# Patient Record
Sex: Female | Born: 1980 | Race: White | Hispanic: No | Marital: Married | Smoking: Never smoker
Health system: Southern US, Community
[De-identification: ages and names within clinical notes are randomized; demographics above are authoritative.]

## PROBLEM LIST (undated history)

## (undated) DIAGNOSIS — K5090 Crohn's disease, unspecified, without complications: Secondary | ICD-10-CM

## (undated) DIAGNOSIS — D649 Anemia, unspecified: Secondary | ICD-10-CM

## (undated) DIAGNOSIS — F419 Anxiety disorder, unspecified: Secondary | ICD-10-CM

## (undated) DIAGNOSIS — N939 Abnormal uterine and vaginal bleeding, unspecified: Secondary | ICD-10-CM

## (undated) DIAGNOSIS — N83201 Unspecified ovarian cyst, right side: Secondary | ICD-10-CM

## (undated) DIAGNOSIS — D219 Benign neoplasm of connective and other soft tissue, unspecified: Secondary | ICD-10-CM

## (undated) DIAGNOSIS — N921 Excessive and frequent menstruation with irregular cycle: Secondary | ICD-10-CM

## (undated) DIAGNOSIS — N840 Polyp of corpus uteri: Secondary | ICD-10-CM

## (undated) DIAGNOSIS — G43909 Migraine, unspecified, not intractable, without status migrainosus: Secondary | ICD-10-CM

## (undated) DIAGNOSIS — Z8489 Family history of other specified conditions: Secondary | ICD-10-CM

## (undated) DIAGNOSIS — J45909 Unspecified asthma, uncomplicated: Secondary | ICD-10-CM

## (undated) HISTORY — DX: Migraine, unspecified, not intractable, without status migrainosus: G43.909

## (undated) HISTORY — PX: NO PAST SURGERIES: SHX2092

## (undated) HISTORY — PX: WISDOM TOOTH EXTRACTION: SHX21

## (undated) HISTORY — PX: COLONOSCOPY W/ POLYPECTOMY: SHX1380

---

## 2001-02-26 ENCOUNTER — Encounter: Payer: Self-pay | Admitting: Internal Medicine

## 2001-03-20 ENCOUNTER — Encounter: Payer: Self-pay | Admitting: Internal Medicine

## 2001-04-30 ENCOUNTER — Encounter: Payer: Self-pay | Admitting: Internal Medicine

## 2001-08-06 ENCOUNTER — Encounter: Payer: Self-pay | Admitting: Internal Medicine

## 2001-12-10 ENCOUNTER — Encounter: Payer: Self-pay | Admitting: Internal Medicine

## 2003-08-14 ENCOUNTER — Encounter: Payer: Self-pay | Admitting: Internal Medicine

## 2005-06-23 ENCOUNTER — Encounter: Payer: Self-pay | Admitting: Internal Medicine

## 2005-06-26 ENCOUNTER — Encounter: Payer: Self-pay | Admitting: Internal Medicine

## 2005-07-18 ENCOUNTER — Encounter: Payer: Self-pay | Admitting: Internal Medicine

## 2007-08-06 ENCOUNTER — Encounter: Payer: Self-pay | Admitting: Internal Medicine

## 2007-11-05 ENCOUNTER — Encounter: Payer: Self-pay | Admitting: Internal Medicine

## 2007-12-31 ENCOUNTER — Encounter: Payer: Self-pay | Admitting: Internal Medicine

## 2008-03-18 DIAGNOSIS — K603 Anal fistula: Secondary | ICD-10-CM

## 2008-03-18 DIAGNOSIS — K5090 Crohn's disease, unspecified, without complications: Secondary | ICD-10-CM | POA: Insufficient documentation

## 2008-03-18 DIAGNOSIS — M949 Disorder of cartilage, unspecified: Secondary | ICD-10-CM

## 2008-03-18 DIAGNOSIS — J45990 Exercise induced bronchospasm: Secondary | ICD-10-CM

## 2008-03-18 DIAGNOSIS — Z862 Personal history of diseases of the blood and blood-forming organs and certain disorders involving the immune mechanism: Secondary | ICD-10-CM

## 2008-03-18 DIAGNOSIS — L408 Other psoriasis: Secondary | ICD-10-CM

## 2008-03-18 DIAGNOSIS — M899 Disorder of bone, unspecified: Secondary | ICD-10-CM | POA: Insufficient documentation

## 2008-03-19 ENCOUNTER — Ambulatory Visit: Payer: Self-pay | Admitting: Internal Medicine

## 2008-03-19 LAB — CONVERTED CEMR LAB
Basophils Relative: 0.8 % (ref 0.0–1.0)
Eosinophils Absolute: 0.3 10*3/uL (ref 0.0–0.7)
Eosinophils Relative: 4.7 % (ref 0.0–5.0)
Hemoglobin: 12.3 g/dL (ref 12.0–15.0)
Lymphocytes Relative: 28.9 % (ref 12.0–46.0)
MCV: 82.3 fL (ref 78.0–100.0)
Neutro Abs: 3.5 10*3/uL (ref 1.4–7.7)
Neutrophils Relative %: 57.5 % (ref 43.0–77.0)
RBC: 4.42 M/uL (ref 3.87–5.11)
Saturation Ratios: 8.3 % — ABNORMAL LOW (ref 20.0–50.0)
Sed Rate: 10 mm/hr (ref 0–22)
WBC: 6.1 10*3/uL (ref 4.5–10.5)

## 2008-03-20 ENCOUNTER — Encounter: Payer: Self-pay | Admitting: Internal Medicine

## 2008-05-21 ENCOUNTER — Encounter: Payer: Self-pay | Admitting: Internal Medicine

## 2008-06-05 ENCOUNTER — Ambulatory Visit: Payer: Self-pay | Admitting: Internal Medicine

## 2008-06-05 LAB — CONVERTED CEMR LAB
Basophils Relative: 0.9 % (ref 0.0–3.0)
HCT: 41 % (ref 36.0–46.0)
Hemoglobin: 13.9 g/dL (ref 12.0–15.0)
Lymphocytes Relative: 25.2 % (ref 12.0–46.0)
Monocytes Relative: 10.3 % (ref 3.0–12.0)
Neutro Abs: 3 10*3/uL (ref 1.4–7.7)
RBC: 4.72 M/uL (ref 3.87–5.11)

## 2008-07-08 ENCOUNTER — Encounter: Payer: Self-pay | Admitting: Internal Medicine

## 2008-07-20 ENCOUNTER — Telehealth: Payer: Self-pay | Admitting: Internal Medicine

## 2008-07-23 ENCOUNTER — Telehealth: Payer: Self-pay | Admitting: Internal Medicine

## 2008-07-30 ENCOUNTER — Ambulatory Visit: Payer: Self-pay | Admitting: Internal Medicine

## 2008-07-30 LAB — CONVERTED CEMR LAB
ALT: 62 units/L — ABNORMAL HIGH (ref 0–35)
AST: 109 units/L — ABNORMAL HIGH (ref 0–37)
Alkaline Phosphatase: 48 units/L (ref 39–117)
Bilirubin, Direct: 0.2 mg/dL (ref 0.0–0.3)
CO2: 31 meq/L (ref 19–32)
Chloride: 104 meq/L (ref 96–112)
Glucose, Bld: 67 mg/dL — ABNORMAL LOW (ref 70–99)
Potassium: 4.3 meq/L (ref 3.5–5.1)
Sodium: 140 meq/L (ref 135–145)
Total Protein: 7 g/dL (ref 6.0–8.3)

## 2008-08-10 ENCOUNTER — Telehealth: Payer: Self-pay | Admitting: Internal Medicine

## 2008-08-12 ENCOUNTER — Encounter: Payer: Self-pay | Admitting: Internal Medicine

## 2008-08-19 ENCOUNTER — Ambulatory Visit (HOSPITAL_COMMUNITY): Admission: RE | Admit: 2008-08-19 | Discharge: 2008-08-19 | Payer: Self-pay | Admitting: Internal Medicine

## 2008-08-20 ENCOUNTER — Telehealth: Payer: Self-pay | Admitting: Internal Medicine

## 2008-08-24 ENCOUNTER — Ambulatory Visit: Payer: Self-pay | Admitting: Internal Medicine

## 2008-08-24 LAB — CONVERTED CEMR LAB
AST: 19 units/L (ref 0–37)
Basophils Absolute: 0 10*3/uL (ref 0.0–0.1)
Hemoglobin: 13.7 g/dL (ref 12.0–15.0)
Lymphocytes Relative: 18.9 % (ref 12.0–46.0)
MCHC: 34.4 g/dL (ref 30.0–36.0)
Neutro Abs: 4.7 10*3/uL (ref 1.4–7.7)
Neutrophils Relative %: 65.2 % (ref 43.0–77.0)
RDW: 13.1 % (ref 11.5–14.6)
Total Bilirubin: 0.6 mg/dL (ref 0.3–1.2)

## 2008-08-31 ENCOUNTER — Telehealth: Payer: Self-pay | Admitting: Internal Medicine

## 2008-09-22 ENCOUNTER — Ambulatory Visit: Payer: Self-pay | Admitting: Internal Medicine

## 2008-09-22 LAB — CONVERTED CEMR LAB
ALT: 16 units/L (ref 0–35)
Albumin: 3.6 g/dL (ref 3.5–5.2)
Total Bilirubin: 0.6 mg/dL (ref 0.3–1.2)

## 2008-09-28 ENCOUNTER — Encounter: Payer: Self-pay | Admitting: Internal Medicine

## 2008-10-05 ENCOUNTER — Encounter: Payer: Self-pay | Admitting: Internal Medicine

## 2008-11-10 ENCOUNTER — Ambulatory Visit: Payer: Self-pay | Admitting: Internal Medicine

## 2008-11-10 LAB — CONVERTED CEMR LAB
ALT: 15 units/L (ref 0–35)
Bilirubin, Direct: 0.1 mg/dL (ref 0.0–0.3)
Total Bilirubin: 0.7 mg/dL (ref 0.3–1.2)

## 2008-12-14 ENCOUNTER — Telehealth: Payer: Self-pay | Admitting: Internal Medicine

## 2008-12-25 ENCOUNTER — Ambulatory Visit: Payer: Self-pay | Admitting: Internal Medicine

## 2008-12-28 LAB — CONVERTED CEMR LAB
AST: 21 units/L (ref 0–37)
Albumin: 3.7 g/dL (ref 3.5–5.2)
BUN: 12 mg/dL (ref 6–23)
Basophils Relative: 0.9 % (ref 0.0–3.0)
Calcium: 9 mg/dL (ref 8.4–10.5)
Creatinine, Ser: 0.8 mg/dL (ref 0.4–1.2)
Eosinophils Absolute: 0.3 10*3/uL (ref 0.0–0.7)
Eosinophils Relative: 6 % — ABNORMAL HIGH (ref 0.0–5.0)
GFR calc non Af Amer: 91.71 mL/min (ref >60)
Glucose, Bld: 75 mg/dL (ref 70–99)
HCT: 38.7 % (ref 36.0–46.0)
Hemoglobin: 12.9 g/dL (ref 12.0–15.0)
Lymphs Abs: 0.8 10*3/uL (ref 0.7–4.0)
MCHC: 33.5 g/dL (ref 30.0–36.0)
MCV: 87 fL (ref 78.0–100.0)
Monocytes Absolute: 0.8 10*3/uL (ref 0.1–1.0)
Neutro Abs: 3.1 10*3/uL (ref 1.4–7.7)
Neutrophils Relative %: 61 % (ref 43.0–77.0)
RBC: 4.44 M/uL (ref 3.87–5.11)
Sed Rate: 11 mm/hr (ref 0–22)
Total Bilirubin: 0.6 mg/dL (ref 0.3–1.2)
WBC: 5 10*3/uL (ref 4.5–10.5)

## 2008-12-31 ENCOUNTER — Ambulatory Visit: Payer: Self-pay | Admitting: Gastroenterology

## 2008-12-31 ENCOUNTER — Telehealth: Payer: Self-pay | Admitting: Internal Medicine

## 2008-12-31 ENCOUNTER — Emergency Department (HOSPITAL_COMMUNITY): Admission: EM | Admit: 2008-12-31 | Discharge: 2008-12-31 | Payer: Self-pay | Admitting: Emergency Medicine

## 2009-01-04 ENCOUNTER — Ambulatory Visit: Payer: Self-pay | Admitting: Internal Medicine

## 2009-01-05 LAB — CONVERTED CEMR LAB
AST: 200 units/L — ABNORMAL HIGH (ref 0–37)
Albumin: 3 g/dL — ABNORMAL LOW (ref 3.5–5.2)
Alkaline Phosphatase: 396 units/L — ABNORMAL HIGH (ref 39–117)
Total Bilirubin: 1.3 mg/dL — ABNORMAL HIGH (ref 0.3–1.2)

## 2009-01-12 ENCOUNTER — Ambulatory Visit: Payer: Self-pay | Admitting: Internal Medicine

## 2009-01-12 LAB — CONVERTED CEMR LAB
ALT: 111 units/L — ABNORMAL HIGH (ref 0–35)
AST: 58 units/L — ABNORMAL HIGH (ref 0–37)
Albumin: 3.3 g/dL — ABNORMAL LOW (ref 3.5–5.2)
Alkaline Phosphatase: 261 units/L — ABNORMAL HIGH (ref 39–117)

## 2009-01-13 ENCOUNTER — Ambulatory Visit: Payer: Self-pay | Admitting: Internal Medicine

## 2009-01-13 DIAGNOSIS — R740 Nonspecific elevation of levels of transaminase and lactic acid dehydrogenase [LDH]: Secondary | ICD-10-CM

## 2009-01-21 ENCOUNTER — Ambulatory Visit: Payer: Self-pay | Admitting: Internal Medicine

## 2009-01-21 LAB — CONVERTED CEMR LAB
ALT: 38 units/L — ABNORMAL HIGH (ref 0–35)
AST: 30 units/L (ref 0–37)
Albumin: 3.5 g/dL (ref 3.5–5.2)
Total Bilirubin: 1 mg/dL (ref 0.3–1.2)

## 2009-01-22 ENCOUNTER — Telehealth: Payer: Self-pay | Admitting: Internal Medicine

## 2009-02-04 ENCOUNTER — Ambulatory Visit: Payer: Self-pay | Admitting: Internal Medicine

## 2009-02-04 LAB — CONVERTED CEMR LAB
ALT: 21 units/L (ref 0–35)
AST: 26 units/L (ref 0–37)
Bilirubin, Direct: 0.1 mg/dL (ref 0.0–0.3)
Total Bilirubin: 0.9 mg/dL (ref 0.3–1.2)

## 2009-02-08 ENCOUNTER — Emergency Department (HOSPITAL_COMMUNITY): Admission: EM | Admit: 2009-02-08 | Discharge: 2009-02-08 | Payer: Self-pay | Admitting: Emergency Medicine

## 2009-02-08 ENCOUNTER — Telehealth: Payer: Self-pay | Admitting: Internal Medicine

## 2009-03-11 ENCOUNTER — Ambulatory Visit: Payer: Self-pay | Admitting: Internal Medicine

## 2009-03-25 ENCOUNTER — Ambulatory Visit: Payer: Self-pay | Admitting: Internal Medicine

## 2009-03-25 LAB — CONVERTED CEMR LAB
ALT: 20 U/L
AST: 24 U/L
Albumin: 3.9 g/dL
Alkaline Phosphatase: 60 U/L
BUN: 11 mg/dL
Basophils Absolute: 0 10*3/uL
Basophils Relative: 0.1 %
CO2: 30 meq/L
Calcium: 9.4 mg/dL
Chloride: 106 meq/L
Creatinine, Ser: 0.7 mg/dL
Eosinophils Absolute: 0.2 10*3/uL
Eosinophils Relative: 3.3 %
GFR calc non Af Amer: 106.79 mL/min
Glucose, Bld: 64 mg/dL — ABNORMAL LOW
HCT: 39.5 %
Hemoglobin: 13 g/dL
Iron: 28 ug/dL — ABNORMAL LOW
Lymphocytes Relative: 19 %
Lymphs Abs: 0.9 10*3/uL
MCHC: 32.8 g/dL
MCV: 85.3 fL
Monocytes Absolute: 0.5 10*3/uL
Monocytes Relative: 10.4 %
Neutro Abs: 3.1 10*3/uL
Neutrophils Relative %: 67.2 %
Platelets: 205 10*3/uL
Potassium: 4.2 meq/L
RBC: 4.63 M/uL
RDW: 13.3 %
Saturation Ratios: 7.6 % — ABNORMAL LOW
Sodium: 141 meq/L
TSH: 1.23 u[IU]/mL
Total Bilirubin: 0.6 mg/dL
Total Protein: 7.6 g/dL
Transferrin: 264.3 mg/dL
Vitamin B-12: 673 pg/mL
WBC: 4.7 10*3/uL

## 2009-04-07 ENCOUNTER — Telehealth: Payer: Self-pay | Admitting: Internal Medicine

## 2009-04-07 ENCOUNTER — Encounter: Payer: Self-pay | Admitting: Internal Medicine

## 2009-04-13 ENCOUNTER — Telehealth: Payer: Self-pay | Admitting: Internal Medicine

## 2009-04-19 ENCOUNTER — Ambulatory Visit (HOSPITAL_COMMUNITY): Admission: RE | Admit: 2009-04-19 | Discharge: 2009-04-19 | Payer: Self-pay | Admitting: Internal Medicine

## 2009-05-27 ENCOUNTER — Ambulatory Visit: Payer: Self-pay | Admitting: Internal Medicine

## 2009-05-28 LAB — CONVERTED CEMR LAB
Basophils Absolute: 0.1 10*3/uL (ref 0.0–0.1)
HCT: 40.8 % (ref 36.0–46.0)
Iron: 69 ug/dL (ref 42–145)
Lymphs Abs: 1.4 10*3/uL (ref 0.7–4.0)
Monocytes Relative: 7.9 % (ref 3.0–12.0)
Neutrophils Relative %: 67.3 % (ref 43.0–77.0)
Platelets: 235 10*3/uL (ref 150.0–400.0)
RDW: 17.4 % — ABNORMAL HIGH (ref 11.5–14.6)
Transferrin: 175.9 mg/dL — ABNORMAL LOW (ref 212.0–360.0)

## 2009-08-17 ENCOUNTER — Encounter: Payer: Self-pay | Admitting: Internal Medicine

## 2009-09-01 ENCOUNTER — Telehealth: Payer: Self-pay | Admitting: Internal Medicine

## 2009-10-25 ENCOUNTER — Ambulatory Visit: Payer: Self-pay | Admitting: Internal Medicine

## 2009-10-25 LAB — CONVERTED CEMR LAB
AST: 17 units/L (ref 0–37)
Albumin: 3.7 g/dL (ref 3.5–5.2)
Alkaline Phosphatase: 51 units/L (ref 39–117)
Eosinophils Absolute: 0.1 10*3/uL (ref 0.0–0.7)
Eosinophils Relative: 1.8 % (ref 0.0–5.0)
MCHC: 32.2 g/dL (ref 30.0–36.0)
MCV: 91.4 fL (ref 78.0–100.0)
Monocytes Absolute: 0.5 10*3/uL (ref 0.1–1.0)
Neutrophils Relative %: 53.9 % (ref 43.0–77.0)
Platelets: 325 10*3/uL (ref 150.0–400.0)
Potassium: 4.2 meq/L (ref 3.5–5.1)
Sed Rate: 17 mm/hr (ref 0–22)
Sodium: 139 meq/L (ref 135–145)
Total Protein: 6.8 g/dL (ref 6.0–8.3)
WBC: 6.9 10*3/uL (ref 4.5–10.5)

## 2009-11-15 ENCOUNTER — Telehealth: Payer: Self-pay | Admitting: Internal Medicine

## 2009-11-16 ENCOUNTER — Telehealth: Payer: Self-pay | Admitting: Internal Medicine

## 2009-11-24 ENCOUNTER — Ambulatory Visit: Payer: Self-pay | Admitting: Internal Medicine

## 2009-11-25 LAB — CONVERTED CEMR LAB
CO2: 32 meq/L (ref 19–32)
Calcium: 9.2 mg/dL (ref 8.4–10.5)
Eosinophils Relative: 1.4 % (ref 0.0–5.0)
GFR calc non Af Amer: 106.26 mL/min (ref >60)
Glucose, Bld: 61 mg/dL — ABNORMAL LOW (ref 70–99)
HCT: 39.6 % (ref 36.0–46.0)
Hemoglobin: 13.1 g/dL (ref 12.0–15.0)
Lymphs Abs: 2 10*3/uL (ref 0.7–4.0)
Monocytes Relative: 7.3 % (ref 3.0–12.0)
Neutro Abs: 6.3 10*3/uL (ref 1.4–7.7)
Platelets: 316 10*3/uL (ref 150.0–400.0)
Sed Rate: 16 mm/hr (ref 0–22)
Sodium: 140 meq/L (ref 135–145)
Total Bilirubin: 0.3 mg/dL (ref 0.3–1.2)
Total Protein: 7 g/dL (ref 6.0–8.3)
WBC: 9.1 10*3/uL (ref 4.5–10.5)

## 2010-01-14 ENCOUNTER — Telehealth: Payer: Self-pay | Admitting: Internal Medicine

## 2010-01-14 ENCOUNTER — Ambulatory Visit: Payer: Self-pay | Admitting: Internal Medicine

## 2010-01-15 LAB — CONVERTED CEMR LAB
ALT: 22 units/L (ref 0–35)
AST: 24 units/L (ref 0–37)
Alkaline Phosphatase: 48 units/L (ref 39–117)
Basophils Absolute: 0 10*3/uL (ref 0.0–0.1)
Bilirubin, Direct: 0.1 mg/dL (ref 0.0–0.3)
CO2: 31 meq/L (ref 19–32)
Chloride: 99 meq/L (ref 96–112)
Hemoglobin: 13.3 g/dL (ref 12.0–15.0)
Lymphocytes Relative: 21.2 % (ref 12.0–46.0)
Monocytes Relative: 13 % — ABNORMAL HIGH (ref 3.0–12.0)
Neutro Abs: 5.2 10*3/uL (ref 1.4–7.7)
Neutrophils Relative %: 63.7 % (ref 43.0–77.0)
Platelets: 237 10*3/uL (ref 150.0–400.0)
Potassium: 3.6 meq/L (ref 3.5–5.1)
RDW: 13.8 % (ref 11.5–14.6)
Sodium: 136 meq/L (ref 135–145)
Total Bilirubin: 0.3 mg/dL (ref 0.3–1.2)
Total Protein: 6.7 g/dL (ref 6.0–8.3)

## 2010-01-18 ENCOUNTER — Telehealth: Payer: Self-pay | Admitting: Internal Medicine

## 2010-01-24 ENCOUNTER — Ambulatory Visit: Payer: Self-pay | Admitting: Internal Medicine

## 2010-02-08 ENCOUNTER — Ambulatory Visit: Payer: Self-pay | Admitting: Internal Medicine

## 2010-02-10 ENCOUNTER — Encounter: Payer: Self-pay | Admitting: Internal Medicine

## 2010-02-16 ENCOUNTER — Ambulatory Visit: Payer: Self-pay | Admitting: Internal Medicine

## 2010-04-01 ENCOUNTER — Encounter: Payer: Self-pay | Admitting: Internal Medicine

## 2010-04-11 ENCOUNTER — Ambulatory Visit: Payer: Self-pay | Admitting: Internal Medicine

## 2010-04-11 LAB — CONVERTED CEMR LAB
Albumin: 3.6 g/dL (ref 3.5–5.2)
Alkaline Phosphatase: 60 units/L (ref 39–117)
Basophils Absolute: 0 10*3/uL (ref 0.0–0.1)
Bilirubin, Direct: 0.1 mg/dL (ref 0.0–0.3)
Eosinophils Absolute: 0.4 10*3/uL (ref 0.0–0.7)
Lymphocytes Relative: 24.2 % (ref 12.0–46.0)
MCHC: 33.6 g/dL (ref 30.0–36.0)
MCV: 88.1 fL (ref 78.0–100.0)
Monocytes Absolute: 0.6 10*3/uL (ref 0.1–1.0)
Neutro Abs: 6.5 10*3/uL (ref 1.4–7.7)
Neutrophils Relative %: 65.7 % (ref 43.0–77.0)
RDW: 14.2 % (ref 11.5–14.6)

## 2010-04-12 ENCOUNTER — Encounter: Payer: Self-pay | Admitting: Internal Medicine

## 2010-04-13 ENCOUNTER — Encounter: Payer: Self-pay | Admitting: Internal Medicine

## 2010-04-18 ENCOUNTER — Telehealth: Payer: Self-pay | Admitting: Internal Medicine

## 2010-04-27 ENCOUNTER — Encounter: Payer: Self-pay | Admitting: Internal Medicine

## 2010-05-09 ENCOUNTER — Ambulatory Visit: Payer: Self-pay | Admitting: Internal Medicine

## 2010-05-09 ENCOUNTER — Telehealth: Payer: Self-pay | Admitting: Internal Medicine

## 2010-05-09 LAB — CONVERTED CEMR LAB
ALT: 12 units/L (ref 0–35)
BUN: 7 mg/dL (ref 6–23)
Basophils Absolute: 0.1 10*3/uL (ref 0.0–0.1)
Basophils Relative: 0.9 % (ref 0.0–3.0)
CO2: 32 meq/L (ref 19–32)
Calcium: 8.7 mg/dL (ref 8.4–10.5)
Chloride: 99 meq/L (ref 96–112)
Creatinine, Ser: 0.7 mg/dL (ref 0.4–1.2)
GFR calc non Af Amer: 111.4 mL/min (ref >60)
Glucose, Bld: 78 mg/dL (ref 70–99)
HCT: 40.4 % (ref 36.0–46.0)
Hemoglobin: 13.5 g/dL (ref 12.0–15.0)
Lymphocytes Relative: 40.1 % (ref 12.0–46.0)
Lymphs Abs: 3.2 10*3/uL (ref 0.7–4.0)
Monocytes Relative: 17.9 % — ABNORMAL HIGH (ref 3.0–12.0)
Neutro Abs: 2.7 10*3/uL (ref 1.4–7.7)
RBC: 4.6 M/uL (ref 3.87–5.11)
RDW: 14.5 % (ref 11.5–14.6)
Total Bilirubin: 0.3 mg/dL (ref 0.3–1.2)

## 2010-05-17 ENCOUNTER — Telehealth: Payer: Self-pay | Admitting: Internal Medicine

## 2010-05-20 ENCOUNTER — Ambulatory Visit: Payer: Self-pay | Admitting: Internal Medicine

## 2010-05-25 ENCOUNTER — Telehealth: Payer: Self-pay | Admitting: Internal Medicine

## 2010-06-22 ENCOUNTER — Telehealth: Payer: Self-pay | Admitting: Internal Medicine

## 2010-06-24 ENCOUNTER — Encounter: Payer: Self-pay | Admitting: Internal Medicine

## 2010-07-05 ENCOUNTER — Telehealth: Payer: Self-pay | Admitting: Internal Medicine

## 2010-07-06 ENCOUNTER — Ambulatory Visit: Payer: Self-pay | Admitting: Physician Assistant

## 2010-07-06 ENCOUNTER — Ambulatory Visit: Payer: Self-pay | Admitting: Gastroenterology

## 2010-07-06 ENCOUNTER — Ambulatory Visit: Payer: Self-pay | Admitting: Cardiovascular Disease

## 2010-07-06 DIAGNOSIS — R634 Abnormal weight loss: Secondary | ICD-10-CM

## 2010-07-06 DIAGNOSIS — R197 Diarrhea, unspecified: Secondary | ICD-10-CM

## 2010-07-06 DIAGNOSIS — K5080 Crohn's disease of both small and large intestine without complications: Secondary | ICD-10-CM

## 2010-07-07 ENCOUNTER — Encounter: Payer: Self-pay | Admitting: Physician Assistant

## 2010-07-07 LAB — CONVERTED CEMR LAB
Basophils Absolute: 0 10*3/uL (ref 0.0–0.1)
Basophils Relative: 0.4 % (ref 0.0–3.0)
CO2: 32 meq/L (ref 19–32)
Calcium: 8.7 mg/dL (ref 8.4–10.5)
Chloride: 99 meq/L (ref 96–112)
Creatinine, Ser: 0.8 mg/dL (ref 0.4–1.2)
Eosinophils Relative: 1.3 % (ref 0.0–5.0)
Glucose, Bld: 59 mg/dL — ABNORMAL LOW (ref 70–99)
HCT: 41.4 % (ref 36.0–46.0)
Hemoglobin: 13.5 g/dL (ref 12.0–15.0)
Lymphocytes Relative: 15.4 % (ref 12.0–46.0)
Lymphs Abs: 1.4 10*3/uL (ref 0.7–4.0)
Monocytes Relative: 8 % (ref 3.0–12.0)
Neutro Abs: 7 10*3/uL (ref 1.4–7.7)
RBC: 4.77 M/uL (ref 3.87–5.11)
RDW: 15.2 % — ABNORMAL HIGH (ref 11.5–14.6)
WBC: 9.3 10*3/uL (ref 4.5–10.5)

## 2010-07-08 ENCOUNTER — Encounter: Payer: Self-pay | Admitting: Physician Assistant

## 2010-07-13 ENCOUNTER — Telehealth: Payer: Self-pay | Admitting: Physician Assistant

## 2010-07-25 ENCOUNTER — Telehealth: Payer: Self-pay | Admitting: Physician Assistant

## 2010-07-29 ENCOUNTER — Telehealth: Payer: Self-pay | Admitting: Physician Assistant

## 2010-08-02 ENCOUNTER — Ambulatory Visit: Payer: Self-pay | Admitting: Internal Medicine

## 2010-08-09 ENCOUNTER — Encounter: Payer: Self-pay | Admitting: Internal Medicine

## 2010-09-29 ENCOUNTER — Telehealth: Payer: Self-pay | Admitting: Internal Medicine

## 2010-10-17 ENCOUNTER — Encounter: Payer: Self-pay | Admitting: Internal Medicine

## 2010-11-01 NOTE — Progress Notes (Signed)
Summary: CONDITION UPDATE  Phone Note Call from Patient Call back at Home Phone [phone]   Caller: Patient Call For: Dr. Juanda Chance Reason for Call: Talk to Nurse Summary of Call: CONDITION UPDATE FROM 10-25-09 Initial call taken by: Karna Christmas,  November 15, 2009 2:26 PM  Follow-up for Phone Call        Message left for patient to callback. Laureen Ochs LPN  November 15, 2009 3:08 PM   Pt. was last seen 10-25-09, was started on Entocort,is still on 9mg  daily.  Continues with loose to watery stools, 6-8 movements daily. Blood with most BM's, has actually gotten worse.  Abd. pain is worse.  Denies fever, n/v. Took Immodium today, seemed to help w/diarrhea.  DR.Cayman Brogden PLEASE ADVISE  Follow-up by: Laureen Ochs LPN,  November 15, 2009 4:17 PM  Additional Follow-up for Phone Call Additional follow up Details #1::        Gavin Pound, please send Asacal 400mg , # 360 , 4 by mouth three times a day, 3 refills, I have spoken to the pt tonight. She will check with [place] in 2 weeks. Additional Follow-up by: Hart Carwin MD,  November 15, 2009 6:43 PM    Additional Follow-up for Phone Call Additional follow up Details #2::    Meds to pt. pharmacy. Follow-up by: Laureen Ochs LPN,  November 16, 2009 8:51 AM  New/Updated Medications: ASACOL 400 MG  TBEC (MESALAMINE) Take 4 caps by mouth 3 times daily. Prescriptions: ASACOL 400 MG  TBEC (MESALAMINE) Take 4 caps by mouth 3 times daily.  #360 x 3   Entered by:   Laureen Ochs LPN   Authorized by:   Hart Carwin MD   Signed by:   Laureen Ochs LPN on 09/81/1914   Method used:   Electronically to        [facility]* (retail)       [address]       [address] Shipping/mailing       [place], [place]  [address]       Ph: [phone]       Fax: [phone]   RxID:   [id number]

## 2010-11-01 NOTE — Assessment & Plan Note (Signed)
Summary: F/U Crohn's flare, saw Mike Gip PA   History of Present Illness Primary GI MD: Lina Sar MD Primary Payslie Mccaig: Elizabeth Sauer, MD Requesting Kalid Ghan: n/a Chief Complaint: f/u Crohns disease flare up. Pt states after stopping the ATB's, she has progressively getting worse. Pt has 8-10 watery loose stools a day with intermittant pain, body aches, fever and overall fatigue. History of Present Illness:   This is 30 year old white female with Crohn's disease of the colon and terminal ileum. She was diagnosed in 2002 at [facility] after presenting with a rectal fistula. She was followed there until June 2009 when she moved to [place]. She was sent in remission when I first met her but since then has had a flareup of diarrhea, cramping and abdominal pain. She is intolerant to Remicade and Humira due to a flareup of psoriasis. She is unable to take Imuran because of bone marrow suppression. She has been flared up now for 4 months and has not responded to Entocort, Prednisone 40 mg a day, and most recently to Cimzia which was started in September of this year. She continues to have about 10 bowel movements a day, 1-2 at night. She has had some crampy lower abdominal pain. There has been no rectal bleeding. She has had a weight loss of 6 pounds since her last appointment. A CT Scan of the abdomen in October of 2011 showed Crohn's disease of the terminal ileum and left colon. Her last colonoscopy in May 2011 showed low-grade colitis with pseudopolyps and Crohn's disease in the terminal ileum. Biopsies showed chronic active mucosal colitis. Additional problems include osteopenia, chronic iron deficiency and psoriasis.   GI Review of Systems    Reports abdominal pain and  loss of appetite.     Location of  Abdominal pain: lower abdomen.    Denies acid reflux, belching, bloating, chest pain, dysphagia with liquids, dysphagia with solids, heartburn, vomiting, vomiting blood,  weight loss, and  weight gain.      Reports diarrhea.     Denies anal fissure, black tarry stools, constipation, diverticulosis, fecal incontinence, heme positive stool, hemorrhoids, irritable bowel syndrome, jaundice, light color stool, liver problems, rectal bleeding, and  rectal pain.    Current Medications (verified): 1)  Asacol 400 Mg  Tbec (Mesalamine) .... Take 4 Caps By Mouth 3 Times Daily. 2)  Lomotil 2.5-0.025 Mg  Tabs (Diphenoxylate-Atropine) .... Take 1 By Mouth Daily As Needed 3)  Cimzia 2 X 200 Mg/ml Kit (Certolizumab Pegol) .... Every 4 Weeks 4)  Prednisone 20 Mg Tabs (Prednisone) .... 2 Tablets By Mouth Every Morning  Allergies (verified): No Known Drug Allergies  Past History:  Past Medical History: Reviewed history from 07/06/2010 and no changes required. 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 Unremarkable  Family History: Reviewed history from 03/19/2008 and no changes required. Family History of Juvenile Rhematoid Arthritis: First Cousin Family History of Diabetes: grandmother paternal  grandfather maternal No FH of Colon Cancer: Family History of Irritable Bowel Syndrome:uncle Father has gastric ulcers  Social History: Reviewed history from 03/19/2008 and no changes required. 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  Alcohol Use - yes Daily Caffeine Use  Review of Systems       The patient complains of fatigue and fever.  The  patient denies allergy/sinus, anemia, anxiety-new, arthritis/joint pain, back pain, blood in urine, breast changes/lumps, change in vision, confusion, cough, coughing up blood, depression-new, fainting, headaches-new, hearing problems, heart murmur,  heart rhythm changes, itching, menstrual pain, muscle pains/cramps, night sweats, nosebleeds, pregnancy symptoms, shortness of breath, skin rash, sleeping problems, sore throat, swelling of feet/legs, swollen lymph glands, thirst - excessive , urination - excessive , urination changes/pain, urine leakage, vision changes, and voice change.         Pertinent positive and negative review of systems were noted in the above HPI. All other ROS was otherwise negative.   Vital Signs:  Patient profile:   30 year old female Height:      65 inches Weight:      111.13 pounds BMI:     18.56 BSA:     1.54 Temp:     98.3 degrees F oral Pulse rate:   100 / minute Pulse rhythm:   regular BP sitting:   98 / 68  (left arm) Cuff size:   regular  Vitals Entered By: Christie Nottingham CMA Duncan Dull) (August 02, 2010 1:24 PM)  Physical Exam  General:  Well developed, well nourished, no acute distress. Eyes:  PERRLA, no icterus. Mouth:  No deformity or lesions, dentition normal. Neck:  Supple; no masses or thyromegaly. Lungs:  Clear throughout to auscultation. Heart:  Regular rate and rhythm; no murmurs, rubs,  or bruits. Abdomen:  soft abdomen with normoactive bowel sounds. Diffusely mildly tender. Mild tympany. No rebound or mass. Liver edge at costal margin. Rectal:  tender anal canal, soft Hemoccult negative stool. Extremities:  No clubbing, cyanosis, edema or deformities noted. Skin:  Intact without significant lesions or rashes. Psych:  Alert and cooperative. Normal mood and affect.   Impression & Recommendations:  Problem # 1:  CROHN'S DISEASE-LARGE & SMALL INTESTINE (ICD-555.2)  Patient has recurrent Crohn's disease of the small bowel and colon which has failed to respond to medical treatment. She is on maximum medical therapy at this point. I have offered to send her back to Dr Gwenith Spitz in [facility]. As an alternative, I offered hospitalization and beginning her on steroids and further diagnostic  studies. We will restart her methotrexate at 25 mg weekly and continue her on mesalamine 4.8 g daily. She it to restart Flagyl 250 mg 3 times a day x 1 week and will then decrease to twice daily dosing. We will also get another set of stools for O&P.  Problem # 2:  WEIGHT LOSS-ABNORMAL (ICD-783.21) Patient is to follow a low residue diet and she will continue drinking Ensure.  Problem # 3:  DIARRHEA (ICD-787.91) Patient may take Lomotil up to 3 times a day p.r.n. Orders: T-Stool for O&P ([id number])  Other Orders: [facility] Sharin Mons)  Patient Instructions: 1)  Your physician requests that you go to the basement floor of our office to have the following labwork completed before leaving today: Stool for O & P 2)  Please pick up your prescriptions at the pharmacy. Electronic prescription(s) has already been sent for methotrexate and Asacol. 3)  We have scheduled you for an appointment with Dr Britt Bolognese at [facility] on Tuesday 08/09/10 @ 8:30 am 4)  Copy Sent To: Dr Elizabeth Sauer, Dr Britt Bolognese 5)  The medication list was reviewed and reconciled.  All changed / newly prescribed medications were explained.  A complete medication list was provided to the patient / caregiver.  Prescriptions: METHOTREXATE SODIUM 50 MG/2ML SOLN (  METHOTREXATE SODIUM) Inject 1 ml (25 mg) Subcutaneously once weekly  #1 vial x 1   Entered by:   Lamona Curl CMA (AAMA)   Authorized by:   Hart Carwin MD   Signed by:   Lamona Curl CMA (AAMA) on 08/02/2010   Method used:   Electronically to        [facility]* (retail)       [address]       [address] Shipping/mailing       [place], [place]  [address]       Ph: [phone]       Fax: [phone]   RxID:   [id number] FLAGYL 250 MG TABS (METRONIDAZOLE) Take 1 tablet by mouth three times a day x 1 week, then decrease to 1 tablet by mouth two times a day  #90 x 1   Entered by:    Lamona Curl CMA (AAMA)   Authorized by:   Hart Carwin MD   Signed by:   Lamona Curl CMA (AAMA) on 08/02/2010   Method used:   Electronically to        [facility]* (retail)       [address]       [address] Shipping/mailing       [place], [place]  [address]       Ph: [phone]       Fax: [phone]   RxID:   [id number] ASACOL 400 MG  TBEC (MESALAMINE) Take 4 caps by mouth 3 times daily.  #360 x 1   Entered by:   Lamona Curl CMA (AAMA)   Authorized by:   Hart Carwin MD   Signed by:   Lamona Curl CMA (AAMA) on 08/02/2010   Method used:   Electronically to        [facility]* (retail)       [address]       [address] Shipping/mailing       [place], [place]  [address]       Ph: [phone]       Fax: [phone]   RxID:   [id number]

## 2010-11-01 NOTE — Letter (Signed)
Summary: [facility]  [facility]   Imported By: Lester [place] 08/26/2010 11:49:43  _____________________________________________________________________  External Attachment:    Type:   Image     Comment:   External Document

## 2010-11-01 NOTE — Progress Notes (Signed)
Summary: Condition update  Phone Note Call from Patient Call back at Home Phone [phone]   Caller: Patient Call For: Dr. Juanda Chance Reason for Call: Talk to Nurse Summary of Call: Update: Since taking the prednisone she has not seen any change. Frequent watery stools, accidents at night, fever, fatique Initial call taken by: Karna Christmas,  July 05, 2010 12:26 PM  Follow-up for Phone Call        Left message for patient to call back Case reviewed with Mike Gip PA , patient needs REV with her tomorrow, needs CBC, BMET, CRP prior to appointment  REV scheduled for 10:30 07/06/10  Follow-up by: Darcey Nora RN, CGRN,  July 05, 2010 3:21 PM    Additional Follow-up for Phone Call Additional follow up Details #2::    patient aware, she is reschedued to 9:00 in the am.   Follow-up by: Darcey Nora RN, CGRN,  July 05, 2010 4:51 PM

## 2010-11-01 NOTE — Progress Notes (Signed)
Summary: Condition update  Phone Note Call from Patient Call back at Home Phone [phone]   Caller: Patient Call For: Mike Gip Reason for Call: Talk to Nurse Summary of Call: Update: Since starting the prednisone and flagyl....less frequent diarrhea, no fever, still some pain Initial call taken by: Karna Christmas,  July 13, 2010 9:25 AM  Follow-up for Phone Call        Called pt back and got mesasge for her  to continue the Flagyl until finished. Continue the Prednisone.  I asked her to call me on Friday or Monday to let us know about her pain.  Follow-up by: Joselyn Glassman,  July 13, 2010 10:13 AM

## 2010-11-01 NOTE — Assessment & Plan Note (Signed)
Summary: F/U FROM PROCEDURE                 Cynthia [name]   History of Present Illness Visit Type: Follow-up Visit Primary GI MD: Lina Sar MD Primary Provider: Elizabeth Sauer, M.D. Requesting Provider: n/a Chief Complaint: F/U Colonoscopy, patient not having any problems currently History of Present Illness:   This is a 30 year old white female with Crohn's disease of the terminal ileum who is status post recent flareup causing diarrhea. She completed Entocort 9 mg a day on April 8. Her labs were normal. She completed a course of Cipro and Flagyl with only minimal improvement of her diarrhea. She now takes Asacol 12 tablets a day and methotrexate 25 mg IM weekly. She was also restarted on Entocort. The diarrhea has improved. Patient had a colonoscopy on 02/08/10 for evaluation of the activity of Crohn's. It showed the patient to have moderately severe Crohn's pancolitis, illeitis with a normal rectum. Pathology confirmed chronic active mucosal colitis consistent with inflammatory bowel disease. Patient comes today for a follow up after her procedure. She is asymptomatic, denying any diarrhea or abdominal pain. She is here to discuss plans for treating her active Crohn's disease. In the pas,t her Humira caused a flareup of psoriasis on her face arms and legs. She was unable to tolerate 6-MP due to leukopenia. Even small doses of 6-MP at 25 mg daily caused bone marrow suppression.   GI Review of Systems      Denies abdominal pain, acid reflux, belching, bloating, chest pain, dysphagia with liquids, dysphagia with solids, heartburn, loss of appetite, nausea, vomiting, vomiting blood, weight loss, and  weight gain.        Denies anal fissure, black tarry stools, change in bowel habit, constipation, diarrhea, diverticulosis, fecal incontinence, heme positive stool, hemorrhoids, irritable bowel syndrome, jaundice, light color stool, liver problems, rectal bleeding, and  rectal pain.    Current Medications  (verified): 1)  Methotrexate Sodium 25 Mg/ml  Soln (Methotrexate Sodium) .... Inject 1 Ml Once A Week.  Discard Vial After 1 Injection. 2)  Folic Acid 1 Mg  Tabs (Folic Acid) .... Take 1 Tablet By Mouth Once A Day. 3)  Entocort Ec 3 Mg Xr24h-Cap (Budesonide) .... Take 3 Tablets By Mouth Once Daily. 4)  Asacol 400 Mg  Tbec (Mesalamine) .... Take 4 Caps By Mouth 3 Times Daily. 5)  Lomotil 2.5-0.025 Mg  Tabs (Diphenoxylate-Atropine) .... Take 1 By Mouth Daily As Needed  Allergies (verified): No Known Drug Allergies  Past History:  Past Medical History: Reviewed history from 03/18/2008 and no changes required. 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 Unremarkable  Family History: Reviewed history from 03/19/2008 and no changes required. Family History of Juvenile Rhematoid Arthritis: First Cousin Family History of Diabetes: grandmother paternal  grandfather maternal No FH of Colon Cancer: Family History of Irritable Bowel Syndrome:uncle Father has gastric ulcers  Social History: Reviewed history from 03/19/2008 and no changes required. 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  Alcohol Use - yes Daily Caffeine Use  Review of Systems       The patient complains of allergy/sinus, cough, and sore throat.  The patient denies anemia, anxiety-new, arthritis/joint pain, back pain, blood in urine, breast changes/lumps, change in vision, confusion, coughing up blood, depression-new, fainting, fatigue, fever, headaches-new,  hearing problems, heart murmur, heart rhythm changes, itching, menstrual pain, muscle pains/cramps, night sweats, nosebleeds, pregnancy symptoms, shortness of breath, skin rash, sleeping problems, swelling  of feet/legs, swollen lymph glands, thirst - excessive , urination - excessive , urination changes/pain, urine leakage, vision changes, and voice change.         Pertinent positive and negative review of systems were noted in the above HPI. All other ROS was otherwise negative.   Vital Signs:  Patient profile:   30 year old female Height:      65 inches Weight:      118.13 pounds BMI:     19.73 Pulse rate:   60 / minute Pulse rhythm:   regular BP sitting:   90 / 60  (left arm) Cuff size:   regular  Vitals Entered By: June McMurray CMA Duncan Dull) (Feb 16, 2010 8:35 AM)   Impression & Recommendations:  Problem # 1:  CROHN'S DISEASE (ICD-555.9)  Patient and I have talked about plans for treatment of Crohn's disease. The options are to restart Humira and have a dermatologist follow her for psoriasis. We will ask Dr. Nicholas Lose to follow patient with me to help me manage her psoriasis during the Humira treatment. She, in the meantime will continue Entocort 9 mg daily and methotrexate injectable 25 mg weekly. She will continue mesalamine 4.8 g daily and I will see her in 8 weeks.  Orders: Dermatology Referral (Derma)  Problem # 2:  OSTEOPENIA (ICD-733.90) Patient had osteopenia on a bone density scan several years ago. She is on Entocort now and will remain on it for the next several weeks. We will consider repeating the bone density scan.  Problem # 3:  NONSPEC ELEVATION OF LEVELS OF TRANSAMINASE/LDH (ICD-790.4) Patient's liver function tests have remained normal.  Problem # 4:  PSORIASIS (ICD-696.1)  Patient has no psoriasis at this time as long as she is on MTX and off Humira to methotrexate.  Orders: Dermatology Referral (Derma)  Patient Instructions: 1)  Continue methotrexate injectable 25 mg weekly. 2)  To see Dr. Nicholas Lose for dermatology consultation. 3)  Continue mesalamine 4.8 g on a daily. 4)  Plan to restart [facility] pending consultation with Dr. Nicholas Lose. 5)  Continue Entocort 9 mg  daily. 6)  Office visit 8 weeks. 7)  Copy sent to : Dr Elizabeth Sauer, Dr Venancio Poisson 8)  The medication list was reviewed and reconciled.  All changed / newly prescribed medications were explained.  A complete medication list was provided to the patient / caregiver. Prescriptions: FOLIC ACID 1 MG  TABS (FOLIC ACID) Take 1 tablet by mouth once a day.  #100 x 0   Entered by:   Lamona Curl CMA (AAMA)   Authorized by:   Hart Carwin MD   Signed by:   Lamona Curl CMA (AAMA) on 02/16/2010   Method used:   Electronically to        [facility]* (retail)       [address]       [address] Shipping/mailing       [place], [place]  [address]       Ph: [phone]       Fax: [phone]   RxID:   [id number] METHOTREXATE SODIUM 25 MG/ML  SOLN (METHOTREXATE SODIUM) Inject 1 ml once a week.  Discard vial after 1 injection.  #2 packs x 1   Entered by:   Lamona Curl CMA (AAMA)   Authorized by:  Hart Carwin MD   Signed by:   Lamona Curl CMA Duncan Dull) on 02/16/2010   Method used:   Electronically to        [facility]* (retail)       [address]       [address] [place] Shipping/mailing       [place], [place]  [address]       Ph: [phone]       Fax: [phone]   RxID:   [id number]

## 2010-11-01 NOTE — Progress Notes (Signed)
Summary: CONDITION UPDATE  Phone Note Call from Patient Call back at Home Phone [phone]   Caller: Patient Call For: Dr. Juanda Chance Reason for Call: Talk to Nurse Summary of Call: reporting that she is not having anymore fevers, but is having problems with watery diarrhea and dehydration Initial call taken by: Vallarie Mare,  January 18, 2010 12:10 PM  Follow-up for Phone Call        Condition update from triage on 01-14-10: Pt. is afebrile, but continues with watery stools, 4-6 daily. The frequency has decreased,  but they are still watery, worse in the mornings.   DR.BRODIE PLEASE ADVISE  Follow-up by: Laureen Ochs LPN,  January 18, 2010 12:41 PM  Additional Follow-up for Phone Call Additional follow up Details #1::        please make sure she takes Entecort 9 mg/day. Lomotil 1 by mouth once daily, schedule colonoscopy, we have talked about it, last exam 2006 in [place]. DB     Additional Follow-up for Phone Call Additional follow up Details #2::    Message left for patient to callback. Laureen Ochs LPN  January 19, 2010 8:29 AM   Above MD orders reviewed with patient. Meds sent to her pharmacy. She has scheduled her Colonoscopy on 02-04-10 at 8am in [facility]. She has an appt. with Dr.Brodie on 01-24-10, she wants to get her prep. instructions at that time, I will notify Ronny Bacon CMA. Pt. instructed to call back as needed.  Follow-up by: Laureen Ochs LPN,  January 19, 2010 3:47 PM  New/Updated Medications: ENTOCORT EC 3 MG XR24H-CAP (BUDESONIDE) Take 3 tablets by mouth once daily. LOMOTIL 2.5-0.025 MG  TABS (DIPHENOXYLATE-ATROPINE) Take 1 by mouth daily. Prescriptions: LOMOTIL 2.5-0.025 MG  TABS (DIPHENOXYLATE-ATROPINE) Take 1 by mouth daily.  #30 x 1   Entered by:   Laureen Ochs LPN   Authorized by:   Hart Carwin MD   Signed by:   Laureen Ochs LPN on 09/81/1914   Method used:   Telephoned to ...       [facility]* (retail)       [address]       [address] Shipping/mailing       [place], [place]  [address]       Ph: [phone]       Fax: [phone]   RxID:   [id number] ENTOCORT EC 3 MG XR24H-CAP (BUDESONIDE) Take 3 tablets by mouth once daily.  #90 x 3   Entered by:   Laureen Ochs LPN   Authorized by:   Hart Carwin MD   Signed by:   Laureen Ochs LPN on 40/34/7425   Method used:   Electronically to        [facility]* (retail)       [address]       [address] Shipping/mailing       [place], [place]  [address]       Ph: [phone]       Fax: [phone]   RxID:   [id number]

## 2010-11-01 NOTE — Assessment & Plan Note (Signed)
Summary: crohn's flare/Cynthia [name]   History of Present Illness Visit Type: Follow-up Visit Primary GI MD: Lina Sar MD Primary Provider: Elizabeth Sauer, MD Requesting Provider: n/a Chief Complaint: Patient c/o crohns flare. Increased fatigue, fever, fatigue. She also has generalizes abdominal cramping and diarrhea as well as blood in the stool. History of Present Illness:   PLEASANT 29 YO FEMAL KNOWN TO DR. Juanda Chance WHO WAS DX WITH CROHNS DISEASE IN 2002.LAST COLONOSCOPY 5/11 SHOWED ACTIVE DISEASE  WITH MODERATE TO SEVERE PANCOLITIS AND ILEITIS WITH RECTAL SPARING. WHEN SEEN BY DR. Juanda Chance IN 8/11 SHE WAS ONLY ON ASACOL, SHE HAD BEEN ON METHOTREXATE BUT STOPPED IT BECAUSE SHE WAS STARTING ON CIMZIA IN [place]. PREVIOUSLY INTOLERANT TO HUMIRA  AND REMICADE AS IT CAUSED PSORIASIS. INTOLERANT TO WITH LEUKOPENIA. AT THIS TIME SHE HAS HAD 3 INJECTIONS OF CIMZIA, AND HAD PROGRESSIVE SXS DESPITE RECENT COURSE OF ENTOCORT, SO SHE STARTED PREDNISONE 30 MG DAILY ABOUT 2 WEEKS AGO. SHE COMES IN TODAY BECAUSE SHE IS NOT ANYBETTER AND MAY BE A LITTLE WORSE. SHE CONTINUE WITH INTERMITTENT FEVERS OF 100,OCCASIONALLY TO 101. SHE HAS FAIRLAY GENERALIZED ABDOMINAL DISCOMFORT,AND IS HAVING8-10 BM'S /DAY,LOOSE URGENT,OCCASIONAL SMALL AMTS OF BLOOD. SHE HAS HAD  INCONTINENT EPISODES DURING THE NIGHT RECENTLY.Marland Kitchen + NAUSEA, NO VOMITING,APPETITE OK, BUT WEIGHT DOWN 10 POUNDS OVER THE PAST COUPLE MONTHS.SHE HAS HAD ABX FOR HER CROHNS , NONE  IN PAST COUPLE MONTHS.    GI Review of Systems    Reports abdominal pain, acid reflux, heartburn, loss of appetite, and  nausea.     Location of  Abdominal pain: generalized.    Denies belching, bloating, chest pain, dysphagia with liquids, dysphagia with solids, vomiting, vomiting blood, weight loss, and  weight gain.      Reports diarrhea, rectal bleeding, and  rectal pain.     Denies anal fissure, black tarry stools, change in bowel habit, constipation, diverticulosis, fecal  incontinence, heme positive stool, hemorrhoids, irritable bowel syndrome, jaundice, light color stool, and  liver problems.    Current Medications (verified): 1)  Asacol 400 Mg  Tbec (Mesalamine) .... Take 4 Caps By Mouth 3 Times Daily. 2)  Lomotil 2.5-0.025 Mg  Tabs (Diphenoxylate-Atropine) .... Take 1 By Mouth Daily As Needed 3)  Cimzia 2 X 200 Mg/ml Kit (Certolizumab Pegol) .... Every 4 Weeks 4)  Prednisone 20 Mg Tabs (Prednisone) .Marland Kitchen.. 1 1/2 Tablets (30 Mg ) Q Am  Allergies (verified): No Known Drug Allergies  Past History:  Past Medical History: Current Problems:  CROHNS DISEASE -PAN COLITIS AND ILEITIS OSTEOPENIA (ICD-733.90) Hx of ANAL FISTULA (ICD-565.1) ANEMIA, HX OF (ICD-V12.3) Hx of EXERCISE INDUCED ASTHMA (ICD-493.81) PSORIASIS (ICD-696.1)  Past Surgical History: Reviewed history from 01/13/2009 and no changes required. Unremarkable  Family History: Reviewed history from 03/19/2008 and no changes required. Family History of Juvenile Rhematoid Arthritis: First Cousin Family History of Diabetes: grandmother paternal  grandfather maternal No FH of Colon Cancer: Family History of Irritable Bowel Syndrome:uncle Father has gastric ulcers  Social History: Reviewed history from 03/19/2008 and no changes required. 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  Alcohol Use - yes Daily Caffeine Use  Review of Systems       The patient complains of fatigue.  The patient denies allergy/sinus, anemia, anxiety-new, arthritis/joint pain, back pain, blood in urine, breast changes/lumps, change in vision, confusion, cough, coughing up blood, depression-new, fainting, fever, headaches-new, hearing problems, heart murmur, heart rhythm changes, itching, menstrual pain, muscle pains/cramps, night sweats, nosebleeds, pregnancy symptoms, shortness  of breath, skin rash, sleeping problems, sore throat, swelling of feet/legs, swollen lymph glands, thirst -  excessive , urination - excessive , urination changes/pain, urine leakage, vision changes, and voice change.         SEE HPI  Vital Signs:  Patient profile:   30 year old female Height:      65 inches Weight:      111.13 pounds BMI:     18.56 BSA:     1.54 Pulse rate:   112 / minute Pulse rhythm:   regular BP sitting:   98 / 70  (right arm)  Vitals Entered By: Lamona Curl CMA Duncan Dull) (July 06, 2010 8:41 AM)  Physical Exam  General:  Well developed, well nourished, no acute distress.,THIN Head:  Normocephalic and atraumatic. Eyes:  PERRLA, no icterus. Lungs:  Clear throughout to auscultation. Heart:  Regular rate and rhythm; no murmurs, rubs,  or bruits. Abdomen:  SOFT, TENDER RLQ WITH SOME FULLNESS, AND LLQ, NO REBOUND, NO MASS OR HSM,BS+ Rectal:  NOT DONE Extremities:  No clubbing, cyanosis, edema or deformities noted. Neurologic:  Alert and  oriented x4;  grossly normal neurologically. Psych:  Alert and cooperative. Normal mood and affect.   Impression & Recommendations:  Problem # 1:  CROHN'S DISEASE-LARGE & SMALL INTESTINE (ICD-555.2) Assessment Deteriorated 29 YO FEMALE WITH CROHNS PANCOLITIS AND ILEITIS WITH PERSISTENT  EXACERBATION,THUS FAR  REFRACTORY TO PREDNISONE AND CIMZIA (3 DOSES).  PT WITH SIGNIFICANT DIARRHEA, WEIGHT LOSS AND INTERMITTENT FEVERS. WILL R/O ANY INFECTIOUS COMPONENT-  LABS  TODAY OBTAIN STOOL CULTURES SCHEDULE FOR CT SCAN ABD/PELVIS  R/O SMALL ABSCESS ETC. INCREASE PREDNISONE TO 40 MG DAILY IN AM CONTINUE CIMZIA Q 4 WEEKS CONTINUE ASACOL-THOUGH NOT SURE SHE IS GETTING MUCH BENEFIT  MAY NEED TO RESTART METHOTREXATE, BUT WILL GET CT, CULTURES ETC FIRST START FLAGYL 250 MG 4 X DAILY X 10 DAYS. FOLLOW UP WITH DR. Juanda Chance IN 2 WEEKS.- WE DISCUSSED POTENTIAL HOSPITALIZATION IF HER SXS DO NOT IMPROVE SOON.  Problem # 2:  OSTEOPENIA (ICD-733.90) Assessment: Comment Only  Problem # 3:  Hx of ANAL FISTULA (ICD-565.1) Assessment: Comment  Only  Problem # 4:  Hx of EXERCISE INDUCED ASTHMA (ICD-493.81) Assessment: Comment Only  Problem # 7:  PSORIASIS (ICD-696.1) Assessment: Comment Only  Other Orders: T-Culture, Stool ([id number]) T-Culture, C-Diff Toxin A/B ([id number]) T-Stool for O&P ([id number]) T-Fecal WBC ([id number]) T- * Misc. Laboratory test [phone]) CT Abdomen/Pelvis with Contrast (CT Abd/Pelvis w/con)  Patient Instructions: 1)  Go to the basement for stool studies today. 2)  You have been scheduled for a CT scan today at 4:30pm and instructions given. 3)  Increase prednisone to 40mg  one tablet by mouth every morning. 4)  Pick up your Flagyl from your pharmacy.  5)  Copy sent to : Elizabeth Sauer, MD 6)  The medication list was reviewed and reconciled.  All changed / newly prescribed medications were explained.  A complete medication list was provided to the patient / caregiver. Prescriptions: FLAGYL 250 MG TABS (METRONIDAZOLE) one tablet by mouth four times a day  #40 x 0   Entered by:   Christie Nottingham CMA (AAMA)   Authorized by:   Sammuel Cooper PA-c   Signed by:   Christie Nottingham CMA (AAMA) on 07/06/2010   Method used:   Electronically to        [facility]* (retail)       [address]       [address] Shipping/mailing       [place], [place]  [address]       Ph: [phone]       Fax: [phone]   RxID:   [id number] FLAGYL 250 MG TABS (METRONIDAZOLE) one tablet by mouth four times a day  #40 x 0   Entered by:   Christie Nottingham CMA (AAMA)   Authorized by:   Sammuel Cooper PA-c   Signed by:   Christie Nottingham CMA (AAMA) on 07/06/2010   Method used:   Electronically to        [facility]* (retail)       [address]       [address] Shipping/mailing       [place], [place]  [address]       Ph: [phone]       Fax: [phone]   RxID:   [id number]

## 2010-11-01 NOTE — Assessment & Plan Note (Signed)
Summary: 8-WEEK F/U APPT...LSW.   History of Present Illness Visit Type: Follow-up Visit Primary GI MD: Lina Sar MD Primary Provider: Elizabeth Sauer, M.D. Requesting Provider: n/a Chief Complaint: F/u for crohn's flare. Pt c/o loose stools but denies any other GI complaints  History of Present Illness:   This is a 30 year old white female with Crohn's disease of the terminal ileum who is status post recent flareup causing diarrhea. She completed Entocort 9 mg a day on April 8. Her labs were normal. She completed a course of Cipro and Flagyl with only minimal improvement of her diarrhea. She continues Asacol 12 tablets a day and methotrexate 25 mg IM weekly. The diarrhea has improved in the last 5 days and she is now having 3 soft bowel movements a day.She was asked to restart the Entecort last week but did not pick it up till this morning.   GI Review of Systems      Denies abdominal pain, acid reflux, belching, bloating, chest pain, dysphagia with liquids, dysphagia with solids, heartburn, loss of appetite, nausea, vomiting, vomiting blood, weight loss, and  weight gain.        Denies anal fissure, black tarry stools, change in bowel habit, constipation, diarrhea, diverticulosis, fecal incontinence, heme positive stool, hemorrhoids, irritable bowel syndrome, jaundice, light color stool, liver problems, rectal bleeding, and  rectal pain.    Current Medications (verified): 1)  Methotrexate Sodium 25 Mg/ml  Soln (Methotrexate Sodium) .... Inject 1 Ml Once A Week.  Discard Vial After 1 Injection. 2)  Folic Acid 1 Mg  Tabs (Folic Acid) .... Take 1 Tablet By Mouth Once A Day. 3)  Entocort Ec 3 Mg Xr24h-Cap (Budesonide) .... Take 3 Tablets By Mouth Once Daily. 4)  Asacol 400 Mg  Tbec (Mesalamine) .... Take 4 Caps By Mouth 3 Times Daily. 5)  Lomotil 2.5-0.025 Mg  Tabs (Diphenoxylate-Atropine) .... Take 1 By Mouth Daily.  Allergies (verified): No Known Drug Allergies  Past History:  Past  Medical History: Reviewed history from 03/18/2008 and no changes required. 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 Unremarkable  Family History: Reviewed history from 03/19/2008 and no changes required. Family History of Juvenile Rhematoid Arthritis: First Cousin Family History of Diabetes: grandmother paternal  grandfather maternal No FH of Colon Cancer: Family History of Irritable Bowel Syndrome:uncle Father has gastric ulcers  Social History: Reviewed history from 03/19/2008 and no changes required. 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  Alcohol Use - yes Daily Caffeine Use  Review of Systems  The patient denies allergy/sinus, anemia, anxiety-new, arthritis/joint pain, back pain, blood in urine, breast changes/lumps, change in vision, confusion, cough, coughing up blood, depression-new, fainting, fatigue, fever, headaches-new, hearing problems, heart murmur, heart rhythm changes, itching, menstrual pain, muscle pains/cramps, night sweats, nosebleeds, pregnancy symptoms, shortness of breath, skin rash, sleeping problems, sore throat, swelling of feet/legs, swollen lymph glands, thirst - excessive , urination - excessive , urination changes/pain, urine leakage, vision changes, and voice change.         Pertinent positive and negative review of systems were noted in the above HPI. All other ROS was otherwise negative.   Vital Signs:  Patient profile:   30 year old female Height:      65 inches Weight:      117 pounds BMI:  19.54 BSA:     1.58 Pulse rate:   68 / minute Pulse rhythm:   regular BP sitting:   100 / 60  (left arm) Cuff size:   regular  Vitals Entered By: Ok Anis CMA (January 24, 2010 8:12  AM)  Physical Exam  General:  Well developed, well nourished, no acute distress. Eyes:  PERRLA, no icterus. Neck:  Supple; no masses or thyromegaly. Lungs:  Clear throughout to auscultation. Heart:  Regular rate and rhythm; no murmurs, rubs,  or bruits. Abdomen:  tender right lower quadrant. Normoactive bowel sounds. No distention. Extremities:  No clubbing, cyanosis, edema or deformities noted. Skin:  Intact without significant lesions or rashes. Psych:  Alert and cooperative. Normal mood and affect.   Impression & Recommendations:  Problem # 1:  CROHN'S DISEASE (ICD-555.9)  Patient has Crohn's disease of the terminal ileum. She is scheduled for a colonoscopy on May 6th. She has not restarted her Entocort as I suggested. However, she is getting better on her own. I asked her to hold off on her Entocort until we do the colonoscopy next week and then decide if she needs to go back on it. In the meantime, she will continue Asacol and methotrexate.  Orders: Colonoscopy (Colon)  Problem # 2:  ANEMIA, HX OF (ICD-V12.3) Patient's last hemoglobin was 13.3 and hematocrit was 39.4.  Problem # 3:  NONSPEC ELEVATION OF LEVELS OF TRANSAMINASE/LDH (ICD-790.4) Patient's most recent liver function tests were normal with an albumin of 3.4.  Patient Instructions: 1)  Please pick up your miralax prep at the pharmacy. 2)  You have been scheduled for a colonoscopy on 02/04/10 @ 8 am. Please arrive at 7:30 am for registration on the 4th floor of the [facility] building. 3)  hold Entocort until colonoscopy has been completed. 4)  Use Lomotil as needed.  5)  Copy sent to : Elizabeth Sauer, MD 6)  The medication list was reviewed and reconciled.  All changed / newly prescribed medications were explained.  A complete medication list was provided to the patient / caregiver. Prescriptions: DULCOLAX 5 MG  TBEC (BISACODYL) Day before procedure take 2 at 3pm and 2 at 8pm.  #4 x 0   Entered by:   Hortense Ramal CMA  (AAMA)   Authorized by:   Hart Carwin MD   Signed by:   Hortense Ramal CMA (AAMA) on 01/24/2010   Method used:   Electronically to        [facility]* (retail)       [address]       [address] Shipping/mailing       [place], [place]  [address]       Ph: [phone]       Fax: [phone]   RxID:   [id number] REGLAN 10 MG  TABS (METOCLOPRAMIDE HCL) As per prep instructions.  #2 x 0   Entered by:   Hortense Ramal CMA (AAMA)   Authorized by:   Hart Carwin MD   Signed by:   Hortense Ramal CMA (AAMA) on 01/24/2010   Method used:   Electronically to        [facility]* (retail)       [address]       [address] Shipping/mailing       [place], [place]  [address]       Ph: [phone]       Fax: [phone]  RxID:   [id number] MIRALAX   POWD (POLYETHYLENE GLYCOL 3350) As per prep  instructions.  #255gm x 0   Entered by:   Hortense Ramal CMA (AAMA)   Authorized by:   Hart Carwin MD   Signed by:   Hortense Ramal CMA (AAMA) on 01/24/2010   Method used:   Electronically to        [facility]* (retail)       [address]       [address] Shipping/mailing       [place], [place]  [address]       Ph: [phone]       Fax: [phone]   RxID:   [id number]

## 2010-11-01 NOTE — Progress Notes (Signed)
Summary: triage  Phone Note Call from Patient Call back at Home Phone [phone]   Caller: Patient Call For: Dr. Juanda Chance Reason for Call: Talk to Nurse Summary of Call: since Friday, fever of 100 degrees with watery diarrhea... taking Cimzia, but symptoms have worsened Initial call taken by: Vallarie Mare,  May 09, 2010 8:26 AM  Follow-up for Phone Call        Talked with pt.  She feel like symptoms have worsened since starting on Cimzia.  First dose was 2 weeks ago and she is due totake the second dose on Wednesday 05/11/10.  She is having watery diarrhea, 8-10 stools per day.  Dairrhea at night also.  For past 3 days has ran a low grade fever of 100 degrees.  Asking what she should do? Follow-up by: Ashok Cordia RN,  May 09, 2010 8:40 AM  Additional Follow-up for Phone Call Additional follow up Details #1::        stop Cimzia, obtain CBC,C-met, start Cipro 250 two times a day, #14, Flagyl 250 three times a day x 1, # 21 week. OK to take Imodium as needed, Additional Follow-up by: Hart Carwin MD,  May 09, 2010 8:59 AM    Additional Follow-up for Phone Call Additional follow up Details #2::    Pt notified.  Pt has lomotil, asks if OK to use this?  Follow-up by: Ashok Cordia RN,  May 09, 2010 9:08 AM  Additional Follow-up for Phone Call Additional follow up Details #3:: Details for Additional Follow-up Action Taken: Lomotil is OK to use for diarrhea, 1-4x/day Additional Follow-up by: Hart Carwin MD,  May 09, 2010 1:11 PM  New/Updated Medications: CIPROFLOXACIN HCL 250 MG  TABS (CIPROFLOXACIN HCL) Take 1 twice a day times 10 days FLAGYL 250 MG TABS (METRONIDAZOLE) 1 by mouth three times a day Prescriptions: LOMOTIL 2.5-0.025 MG  TABS (DIPHENOXYLATE-ATROPINE) Take 1 by mouth daily as needed  #50 x 0   Entered by:   Ashok Cordia RN   Authorized by:   Hart Carwin MD   Signed by:   Ashok Cordia RN on 05/09/2010   Method used:   Printed then faxed to ...      [facility]* (retail)       [address]       [address] Shipping/mailing       [place], [place]  [address]       Ph: [phone]       Fax: [phone]   RxID:   [id number] FLAGYL 250 MG TABS (METRONIDAZOLE) 1 by mouth three times a day  #21 x 0   Entered by:   Ashok Cordia RN   Authorized by:   Hart Carwin MD   Signed by:   Ashok Cordia RN on 05/09/2010   Method used:   Electronically to        [facility]* (retail)       [address]       [address] Shipping/mailing       [place], [place]  [address]       Ph: [phone]       Fax: [phone]   RxID:   [id number] CIPROFLOXACIN HCL 250 MG  TABS (CIPROFLOXACIN HCL) Take 1 twice a day times 10 days  #14 x 0   Entered by:   Ashok Cordia RN   Authorized by:   Hedwig Morton  Juanda Chance MD   Signed by:   Ashok Cordia RN on 05/09/2010   Method used:   Electronically to        [facility]* (retail)       [address]       [address] [place] Shipping/mailing       [place], [place]  [address]       Ph: [phone]       Fax: [phone]   RxID:   [id number]

## 2010-11-01 NOTE — Letter (Signed)
Summary: [facility] Instructions  [facility]  [address] [place], [place] [address]   Phone: [phone]  Fax: [phone]       Cynthia [name]    [birth year]    MRN: [id number]       Procedure Day /Date: 02/04/10 Friday     Arrival Time: 7:30 am     Procedure Time: 8:00 am     Location of Procedure:                    _x _  [facility] (4th Floor)  PREPARATION FOR COLONOSCOPY WITH MIRALAX  Starting 5 days prior to your procedure 01/30/10 do not eat nuts, seeds, popcorn, corn, beans, peas,  salads, or any raw vegetables.  Do not take any fiber supplements (e.g. Metamucil, Citrucel, and Benefiber). ____________________________________________________________________________________________________   THE DAY BEFORE YOUR PROCEDURE         DATE: 02/03/10 DAY: Thursday  1   Drink clear liquids the entire day-NO SOLID FOOD  2   Do not drink anything colored red or purple.  Avoid juices with pulp.  No orange juice.  3   Drink at least 64 oz. (8 glasses) of fluid/clear liquids during the day to prevent dehydration and help the prep work efficiently.  CLEAR LIQUIDS INCLUDE: Water Jello Ice Popsicles Tea (sugar ok, no milk/cream) Powdered fruit flavored drinks Coffee (sugar ok, no milk/cream) Gatorade Juice: apple, white grape, white cranberry  Lemonade Clear bullion, consomm, broth Carbonated beverages (any kind) Strained chicken noodle soup Hard Candy  4   Mix the entire bottle of Miralax with 64 oz. of Gatorade/Powerade in the morning and put in the refrigerator to chill.  5   At 3:00 pm take 2 Dulcolax/Bisacodyl tablets.  6   At 4:30 pm take one Reglan/Metoclopramide tablet.  7  Starting at 5:00 pm drink one 8 oz glass of the Miralax mixture every 15-20 minutes until you have finished drinking the entire 64 oz.  You should finish drinking prep around 7:30 or 8:00 pm.  8   If you are nauseated, you may take the 2nd Reglan/Metoclopramide tablet at  6:30 pm.        9    At 8:00 pm take 2 more DULCOLAX/Bisacodyl tablets.        THE DAY OF YOUR PROCEDURE      DATE:  02/04/10 DAY: Friday  You may drink clear liquids until 6:00 am  (2 HOURS BEFORE PROCEDURE).   MEDICATION INSTRUCTIONS  Unless otherwise instructed, you should take regular prescription medications with a small sip of water as early as possible the morning of your procedure.        OTHER INSTRUCTIONS  You will need a responsible adult at least 29 years of age to accompany you and drive you home.   This person must remain in the waiting room during your procedure.  Wear loose fitting clothing that is easily removed.  Leave jewelry and other valuables at home.  However, you may wish to bring a book to read or an iPod/MP3 player to listen to music as you wait for your procedure to start.  Remove all body piercing jewelry and leave at home.  Total time from sign-in until discharge is approximately 2-3 hours.  You should go home directly after your procedure and rest.  You can resume normal activities the day after your procedure.  The day of your procedure you should not:   Drive   Make  legal decisions   Operate machinery   Drink alcohol   Return to work  You will receive specific instructions about eating, activities and medications before you leave.   The above instructions have been reviewed and explained to me by   Hortense Ramal CMA Duncan Dull)  January 24, 2010 8:33 AM    I fully understand and can verbalize these instructions _____________________________ Date 01/24/10

## 2010-11-01 NOTE — Assessment & Plan Note (Signed)
Summary: rx renewal..em   History of Present Illness Visit Type: Follow-up Visit Primary GI MD: Lina Sar MD Primary Provider: Elizabeth Sauer, M.D. Chief Complaint: Patient has been having a recent crohns flare for baout 3 weeks. She states that she has been having lower abdominal pain, diarrhea, and a small amount of BRB.  History of Present Illness:   This is a 30 year old white female with Crohn's disease of the terminal ileum diagnosed in 2006 at [facility] and subsequently followed at [facility] in [place]. She had a flareup 2 years ago and responded to Entocort. 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 She is also unable to take biologicals due to flareups of her psoriasis. She was on steroids for 1 year  during her initial diagnosis but is currently reluctant to go back on it. She has been having a flareup of diarrhea and crampy abdominal pain for the past 3 weeks. She has seen a small amount of blood per rectum. She has not missed any work. Patient has been managed with methotrexate injectable 25 mg weekly. She had a  mono last spring  associated with abnormal liver function tests but recovered without consequences. She now has  2-3 weeks of diarrhea.   GI Review of Systems    Reports abdominal pain.     Location of  Abdominal pain: lower abdomen.    Denies acid reflux, belching, bloating, chest pain, dysphagia with liquids, dysphagia with solids, heartburn, loss of appetite, nausea, vomiting, vomiting blood, weight loss, and  weight gain.      Reports diarrhea and  rectal bleeding.     Denies anal fissure, black tarry stools, change in bowel habit, constipation, diverticulosis, fecal incontinence, heme positive stool, hemorrhoids, irritable bowel syndrome, jaundice, light color stool, liver problems, and  rectal pain.    Current Medications (verified): 1)  Methotrexate Sodium 25 Mg/ml  Soln (Methotrexate Sodium) .... Inject 1 Ml Once A Week.  Discard Vial After  1 Injection. Must Keep Office Visit For Further Refills! 2)  Folic Acid 1 Mg  Tabs (Folic Acid) .... Take 1 Tablet By Mouth Once A Day.  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  Alcohol Use - yes Daily Caffeine Use  Review of Systems  The patient denies allergy/sinus, anemia, anxiety-new, arthritis/joint pain, back pain, blood in urine, breast changes/lumps, change in vision, confusion, cough, coughing up blood, depression-new, fainting, fatigue, fever, headaches-new, hearing problems, heart murmur, heart rhythm changes, itching, menstrual pain, muscle pains/cramps, night sweats, nosebleeds, pregnancy symptoms, shortness of breath, skin rash, sleeping problems, sore throat, swelling of feet/legs, swollen lymph glands, thirst - excessive , urination - excessive , urination changes/pain, urine leakage, vision changes, and voice change.         Pertinent positive and negative review of systems were noted in the above HPI. All other ROS was otherwise negative.   Vital Signs:  Patient profile:  30 year old female Height:      65 inches Weight:      117.0 pounds BMI:     19.54 Pulse rate:   60 / minute Pulse rhythm:   regular BP sitting:   96 / 58  (left arm) Cuff size:    regular  Vitals Entered By: Harlow Mares CMA Duncan Dull) (October 25, 2009 11:23 AM)  Physical Exam  General:  Well developed, well nourished, no acute distress. Eyes:  PERRLA, no icterus. Mouth:  no aphthous ulcers Neck:  Supple; no masses or thyromegaly. Lungs:  Clear throughout to auscultation. Heart:  Regular rate and rhythm; no murmurs, rubs,  or bruits. Abdomen:  soft  abdomen with minimal tenderness in epigastrium and right and  left lower quadrant. No fullness or  rebound. Rectal:  normal rectal tone stool is solved Hemoccult-negative Skin:  Intact without significant lesions or rashes. Psych:  Alert and cooperative. Normal mood and affect.   Impression & Recommendations:  Problem # 1:  NONSPEC ELEVATION OF LEVELS OF TRANSAMINASE/LDH (ICD-790.4) Patient is status post mono. We will recheck her liver tests today.  Problem # 2:  CROHN'S DISEASE (ICD-555.9) Patient's last colonoscopy was in 2006 and it showed disease in the terminal ileum and ileocecal valve. Patient responded to Entocort 9 mg daily. We will restart Entocort again. I would also consider Asacol or systemic steroids. I may consider  repeat colonoscopy if her symptoms do not subside. Orders: TLB-CBC Platelet - w/Differential (85025-CBCD) TLB-CMP (Comprehensive Metabolic Pnl) (80053-COMP) TLB-IBC Pnl (Iron/FE;Transferrin) (83550-IBC) TLB-B12, Serum-Total ONLY ([address]-U04) TLB-Sedimentation Rate (ESR) (85652-ESR)  Problem # 3:  ANEMIA, HX OF (ICD-V12.3) Patient is status post iron infusion in July 2010. She is intolerant to oral iron. We will check her CBC today.  Patient Instructions: 1)  CBC,CMET, IBC Panel, B12 levels and sedimentation rate to be drawn today. 2)  Begin Entocort 9 mg daily x 4 weeks, 6 mg daily x 4 weeks, 3 mg daily x 4 weeks. 3)  Refills given on methotrexate. 4)  Office Visit in 8 weeks. 5)  Copy sent to : Elizabeth Sauer, MD Prescriptions: METHOTREXATE SODIUM 25 MG/ML  SOLN (METHOTREXATE  SODIUM) Inject 1 ml once a week.  Discard vial after 1 injection.  #2 x 2   Entered by:   Hortense Ramal CMA (AAMA)   Authorized by:   Hart Carwin MD   Signed by:   Hortense Ramal CMA (AAMA) on 10/25/2009   Method used:   Electronically to        [facility]* (retail)       [address]       [address] Shipping/mailing       [place], [place]  [address]       Ph: [phone]       Fax: [phone]   RxID:   [id number] ENTOCORT EC 3 MG XR24H-CAP (BUDESONIDE) Take 3 tablets p.o. q.d. x 4 weeks. Then, Take 2 tablets p.o. q.d.x 4 weeks. Then, take 1 tablet p.o. q.d. x 4 weeks  #180 x 0   Entered by:   Hortense Ramal CMA (AAMA)   Authorized by:   Hart Carwin MD   Signed by:   Hortense Ramal CMA (AAMA) on 10/25/2009   Method used:   Electronically to        [facility]* (retail)       [address]       [address]mail[address]       [place], [place]  [address]       Ph: [phone]       Fax: [phone]   RxID:   [id number]

## 2010-11-01 NOTE — Progress Notes (Signed)
Summary: Condition Update  Phone Note Outgoing Call   Call placed by: Lamona Curl CMA Duncan Dull),  May 25, 2010 4:48 PM Call placed to: Patient Summary of Call: Called patient to get an update on her condition (At office visit 05/20/10, Dr Juanda Chance requested patient call with update today). Patient states that she has had no fever, however, her diarrhea is getting worse. She is now having 4-5 watery bowel movements daily. Do we need to continue patient on cimzia or start patient back on entocort or methotrexate? Initial call taken by: Lamona Curl CMA Duncan Dull),  May 25, 2010 4:50 PM  Follow-up for Phone Call        continue Cimzia, restart Entecort 9 mg/day. Give it 6 weeks then call back. If sudden worsening, call back before 6 weeks. Follow-up by: Hart Carwin MD,  May 26, 2010 8:56 AM  Additional Follow-up for Phone Call Additional follow up Details #1::        Left message for patient to call back Darcey Nora RN, [facility]  May 26, 2010 10:56 AM  Patient  aware of Dr Regino Schultze recommendations Darcey Nora RN, [facility]  May 27, 2010 8:36 AM    New/Updated Medications: ENTOCORT EC 3 MG XR24H-CAP (BUDESONIDE) 3 by mouth once daily Prescriptions: ENTOCORT EC 3 MG XR24H-CAP (BUDESONIDE) 3 by mouth once daily  #90 x 1   Entered by:   Darcey Nora RN, CGRN   Authorized by:   Hart Carwin MD   Signed by:   Darcey Nora RN, CGRN on 05/27/2010   Method used:   Electronically to        [facility]* (retail)       [address]       [address] Shipping/mailing       [place], [place]  [address]       Ph: [phone]       Fax: [phone]   RxID:   [id number]

## 2010-11-01 NOTE — Assessment & Plan Note (Signed)
Summary: F/U APPT...LSW.   History of Present Illness Visit Type: Follow-up Visit Primary GI MD: Lina Sar MD Primary Provider: Elizabeth Sauer, M.D. Requesting Provider: n/a Chief Complaint: Patient here for follow up of her crohns she complains of some watery diarrhea and a low grade fever yesterday.  History of Present Illness:   This is 30 year old white female with Crohn's disease since 2002. The diagnosis was acutally made with an anal fistula. She failed Remicade and Humira. Both biologicals caused flareup of her psoriasis. He just started Cimzia but discontinued it after her first dose due to fever and diarrhea. She just restarted the second dose 2 days ago and so far has done well. She is having 1-2 bowel movements a day and denies crampy abdominal pain. She stopped her methotrexate as well as her Entocort. She is only on Asacol 4.8 g daily. She has 6 MP intolerance, exercise-induced asthma, history of anemia, and psoriasis. Her last colonoscopy in May 2011 showed moderate to severe Crohn's pan colitis and ileitis with rectal sparing.Marland Kitchen   GI Review of Systems      Denies abdominal pain, acid reflux, belching, bloating, chest pain, dysphagia with liquids, dysphagia with solids, heartburn, loss of appetite, nausea, vomiting, vomiting blood, weight loss, and  weight gain.      Reports diarrhea.     Denies anal fissure, black tarry stools, change in bowel habit, constipation, diverticulosis, fecal incontinence, heme positive stool, hemorrhoids, irritable bowel syndrome, jaundice, light color stool, liver problems, rectal bleeding, and  rectal pain.    Current Medications (verified): 1)  Asacol 400 Mg  Tbec (Mesalamine) .... Take 4 Caps By Mouth 3 Times Daily. 2)  Lomotil 2.5-0.025 Mg  Tabs (Diphenoxylate-Atropine) .... Take 1 By Mouth Daily As Needed 3)  Cimzia 2 X 200 Mg/ml Kit (Certolizumab Pegol) .... Every 2 Week... Then Every Four Weeks  Allergies (verified): No Known Drug  Allergies  Past History:  Past Medical History: Reviewed history from 03/18/2008 and no changes required. 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 Unremarkable  Family History: Reviewed history from 03/19/2008 and no changes required. Family History of Juvenile Rhematoid Arthritis: First Cousin Family History of Diabetes: grandmother paternal  grandfather maternal No FH of Colon Cancer: Family History of Irritable Bowel Syndrome:uncle Father has gastric ulcers  Social History: Reviewed history from 03/19/2008 and no changes required. 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  Alcohol Use - yes Daily Caffeine Use  Review of Systems       The patient complains of fever.  The patient denies allergy/sinus, anemia, anxiety-new, arthritis/joint pain, back pain, blood in urine, breast changes/lumps, change in vision, confusion, cough, coughing up blood, depression-new, fainting, fatigue, headaches-new, hearing problems, heart murmur, heart rhythm changes, itching, menstrual pain, muscle pains/cramps, night sweats, nosebleeds, pregnancy symptoms, shortness of breath, skin rash, sleeping problems, sore throat, swelling of feet/legs, swollen lymph glands, thirst - excessive , urination - excessive , urination changes/pain, urine leakage, vision changes, and voice change.         Pertinent positive and negative review of systems were noted in the above HPI. All other ROS was otherwise negative.   Vital Signs:  Patient profile:   30 year old female Height:      65 inches Weight:      117.2 pounds  BMI:     19.57 Pulse rate:   80 / minute Pulse rhythm:   regular BP sitting:   80 / 56  (left arm) Cuff size:    regular  Vitals Entered By: Harlow Mares CMA Duncan Dull) (May 20, 2010 3:57 PM)  Physical Exam  General:  Well developed, well nourished, no acute distress. Lungs:  Clear throughout to auscultation. Heart:  Regular rate and rhythm; no murmurs, rubs,  or bruits. Abdomen:  Soft, nontender and nondistended. No masses, hepatosplenomegaly or hernias noted. Normal bowel sounds. Extremities:  injection sites both thighs. Skin:  5 mm patch of psoriasis in the right axilla.   Impression & Recommendations:  Problem # 1:  CROHN'S DISEASE (ICD-555.9) We are holding methotrexate and Entocort. She is to continue on Asacol 4.8 g daily. She is doing reasonably well having only 2 bowel movements a day. We will continue the same regimen. She is to call me next week with an update on Cimzia. We will then decide if she is to continue Cimzia. If there are side effects, we will have to restart either the methotrexate or Entocort.  Problem # 2:  ANEMIA, HX OF (ICD-V12.3) Patient's labs on August 8th were normal.  Patient Instructions: 1)  Continue Asacol at 4.8 g daily. 2)  Call next Wednesday with an update of condition and we will decide if patient is to continue with Cimzia. 3)  Methotrexate and Entocort are on hold. 4)  Copy sent to : Dr Elizabeth Sauer 5)  The medication list was reviewed and reconciled.  All changed / newly prescribed medications were explained.  A complete medication list was provided to the patient / caregiver.

## 2010-11-01 NOTE — Progress Notes (Signed)
Summary: Triage  Phone Note Call from Patient Call back at Home Phone [phone]   Caller: Patient Call For: Amy Esterwood Summary of Call: Update: Since finishing the antibotic her stools have been loose, she is going to keep her appt. on 08-02-10 Initial call taken by: Karna Christmas,  July 29, 2010 11:35 AM  Follow-up for Phone Call        I called the pt to let her  know I got her message. I advised her to keep her appt with Dr. Juanda Chance on 08-02-10. She started regressing some with some very loose stools.  Per Amy she can take the Lomotil 2-3 times daily if needed.  Follow-up by: Joselyn Glassman,  July 29, 2010 1:52 PM

## 2010-11-01 NOTE — Progress Notes (Signed)
Summary: Triage  Phone Note Call from Patient Call back at Home Phone [phone]   Caller: Patient Call For: Dr. Juanda Chance Reason for Call: Talk to Nurse Summary of Call: Wants to know if she should restart the Cimzia injections Initial call taken by: Karna Christmas,  May 17, 2010 4:57 PM  Follow-up for Phone Call        Spoke with the patient this am , loose stool 1-2 times a day, no further fever, better energy.  Last Cimzia injection was 3 weeks ago.  Please advise if ok to restart Cimzia.   Follow-up by: Darcey Nora RN, CGRN,  May 18, 2010 9:15 AM  Additional Follow-up for Phone Call Additional follow up Details #1::        left message on pt's phone, OK to restart Cimzia. Take Tylenol 2 by mouth prior to the injection. Additional Follow-up by: Hart Carwin MD,  May 18, 2010 1:10 PM

## 2010-11-01 NOTE — Miscellaneous (Signed)
Summary: Cimzia Injection Training/[facility]  Cimzia Injection Training/[facility]   Imported By: Sherian Rein 05/09/2010 11:32:43  _____________________________________________________________________  External Attachment:    Type:   Image     Comment:   External Document

## 2010-11-01 NOTE — Progress Notes (Signed)
Summary: TRIAGE  Phone Note Call from Patient Call back at Home Phone [phone]   Caller: Patient Call For: Juanda Chance Reason for Call: Talk to Nurse Complaint: Chest Pain Summary of Call: Patient states that she left work sick and dehydrated and is wondering if Dr Juanda Chance would want to put her back on Prednisone. Initial call taken by: Tawni Levy,  November 16, 2009 3:25 PM  Follow-up for Phone Call        See triage from 11-15-09. She hasn't started Asacol yet. Today she woke up with low-grade fever, weakness and dehydrated. She feels alot worse today, didn't go to work. Wants to know if she needs to take Prednisone instead of Asacol?  DR.Rolando Whitby PLEASE ADVISE  Follow-up by: Laureen Ochs LPN,  November 16, 2009 3:51 PM  Additional Follow-up for Phone Call Additional follow up Details #1::        Sorry it took me so long to respond since I am out of town. Please let her know that under the circumstances, I agree that Prednisone is the quickest way to get better and  she ought to start on Prednisone 20 mg by mouth two times a day, #60, 3 refills. Offer Xanax .25 mg ,#30 1 by mouth as needed anxiety, to counteract the effect of the Prednisone. OV in 2-3 weeks. She needs to stay on that dose for 2 weeks, she needs to call if too many side effects and we may reduce to 30mg /day. Thanx Additional Follow-up by: Hart Carwin MD,  November 18, 2009 12:20 PM    Additional Follow-up for Phone Call Additional follow up Details #2::    Message left for patient to callback.Laureen Ochs LPN  November 18, 2009 12:22 PM Message left for patient to callback.Laureen Ochs LPN  November 19, 2009 10:11 AM   Pt. still hasn't started on Asacol. She is better today, no fever and had a normal  BM, thinks she had the GI bug, not a flare-up. She doesn't want to start the Prednisone at this time. She is going on a mission trip out of the country on 12-04-09, wants to see Dr.Renita Brocks before then. She will  see Dr.Jovanni Eckhart on 11-24-09. Pt. instructed to call back as needed.  Follow-up by: Laureen Ochs LPN,  November 19, 2009 12:42 PM

## 2010-11-01 NOTE — Assessment & Plan Note (Signed)
Summary: F/U FROM ? [facility]             [facility]   History of Present Illness Visit Type: Follow-up Visit Primary GI MD: Lina Sar MD Primary Provider: Elizabeth Sauer, M.D. Requesting Provider: n/a Chief Complaint: F/u for crohn's. Pt states that she is feeling better  History of Present Illness:   This is a 30 year old white female with Crohn's disease of the terminal ileum diagnosed in 2006 at [facility] and subsequently followed at [facility] in [place]. She had a flareup 2 years ago and responded to Entocort. 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 She is also unable to take biologicals due to flareups of her psoriasis. She was on steroids for 1 year during her initial diagnosis but is currently reluctant to go back on it. She is leaving for the [place] next week. Patient had a low grade temperature last week but has felt much better in the last few days having almost formed stools and no abdominal pain. Her stools are still somewhat loose.   GI Review of Systems      Denies abdominal pain, acid reflux, belching, bloating, chest pain, dysphagia with liquids, dysphagia with solids, heartburn, loss of appetite, nausea, vomiting, vomiting blood, weight loss, and  weight gain.        Denies anal fissure, black tarry stools, change in bowel habit, constipation, diarrhea, diverticulosis, fecal incontinence, heme positive stool, hemorrhoids, irritable bowel syndrome, jaundice, light color stool, liver problems, rectal bleeding, and  rectal pain.    Current Medications (verified): 1)  Methotrexate Sodium 25 Mg/ml  Soln (Methotrexate Sodium) .... Inject 1 Ml Once A Week.  Discard Vial After 1 Injection. 2)  Folic Acid 1 Mg  Tabs (Folic Acid) .... Take 1 Tablet By Mouth Once A Day. 3)  Entocort Ec 3 Mg Xr24h-Cap (Budesonide) .... Take 3 Tablets P.o. Q.d. X 4 Weeks. Then, Take 2 Tablets P.o. Q.d.x 4 Weeks. Then, Take 1 Tablet P.o. Q.d. X 4 Weeks 4)   Asacol 400 Mg  Tbec (Mesalamine) .... Take 4 Caps By Mouth 3 Times Daily.  Allergies (verified): No Known Drug Allergies  Past History:  Past Medical History: Reviewed history from 03/18/2008 and no changes required. 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 Unremarkable  Family History: Reviewed history from 03/19/2008 and no changes required. Family History of Juvenile Rhematoid Arthritis: First Cousin Family History of Diabetes: grandmother paternal  grandfather maternal No FH of Colon Cancer: Family History of Irritable Bowel Syndrome:uncle Father has gastric ulcers  Social History: Reviewed history from 03/19/2008 and no changes required. 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  Alcohol Use - yes Daily Caffeine Use  Review of Systems  The patient denies allergy/sinus, anemia, anxiety-new, arthritis/joint pain, back pain, blood in urine, breast changes/lumps, change in vision, confusion, cough, coughing up blood, depression-new, fainting, fatigue, fever, headaches-new, hearing problems, heart murmur, heart rhythm changes, itching, menstrual pain, muscle pains/cramps, night sweats, nosebleeds, pregnancy symptoms, shortness of breath, skin rash, sleeping problems, sore throat, swelling of feet/legs, swollen lymph glands, thirst - excessive , urination - excessive , urination changes/pain, urine leakage, vision changes, and voice change.         Pertinent positive and negative review of systems were noted  in the above HPI. All other ROS was otherwise negative.   Vital Signs:  Patient profile:   30 year old female Height:      65 inches Weight:      115 pounds BMI:     19.21 BSA:     1.56 Pulse rate:   62  / minute Pulse rhythm:   regular BP sitting:   100 / 60  (left arm) Cuff size:   regular  Vitals Entered By: Ok Anis CMA (November 24, 2009 8:16 AM)  Physical Exam  General:  thin, alert and oriented. Eyes:  PERRLA, no icterus. Mouth:  No deformity or lesions, dentition normal. Neck:  Supple; no masses or thyromegaly. Lungs:  Clear throughout to auscultation. Heart:  Regular rate and rhythm; no murmurs, rubs,  or bruits. Abdomen:  soft abdomen with normal active bowel sounds. Tenderness along left lower quadrant. No distention. Liver edge of the posterior margin. Extremities:  No clubbing, cyanosis, edema or deformities noted. Skin:  Intact without significant lesions or rashes. Psych:  Alert and cooperative. Normal mood and affect.   Impression & Recommendations:  Problem # 1:  CROHN'S DISEASE (ICD-555.9) Patient has Crohn's disease at the ileocecal area.. Her last colonoscopy was in 2006. She is status post a flareup of her Crohn's disease. The patient never took prednisone or Asacol as suggested by [place]. She continues on Entocort 9 mg daily. I have suggested to take Flagyl and Cipro with her on her trip and keep her appointment for March 22nd. We will then decide if we are going to go ahead with a colonoscopy to assess the activity of her disease. Today we will check a CBC and metabolic panel. Orders: TLB-CBC Platelet - w/Differential (85025-CBCD) TLB-CMP (Comprehensive Metabolic Pnl) (80053-COMP) TLB-Sedimentation Rate (ESR) (85652-ESR)  Problem # 2:  PSORIASIS (ICD-696.1)  psoriasis is under good control with methotrexate 25 mg weekly.  Problem # 3:  ANEMIA, HX OF (ICD-V12.3)  history of iron deficiency anemia. We will check CBC today.  Patient Instructions: 1)  continue Entocort 9 mg daily. 2)  Flagyl 250 mg p.o. t.i.d. and Cipro 250 mg p.o. b.i.d. while on a trip to the [place]. 3)  Low residue diet. 4)  Continue methotrexate. 5)  Office visit March  22. 6)  Copy sent to : Dr Elizabeth Sauer 7)  The medication list was reviewed and reconciled.  All changed / newly prescribed medications were explained.  A complete medication list was provided to the patient / caregiver. Prescriptions: CIPRO 250 MG TABS (CIPROFLOXACIN HCL) Take 1 tablet by mouth two times a day  #14 x 0   Entered by:   Hortense Ramal CMA (AAMA)   Authorized by:   Hart Carwin MD   Signed by:   Hortense Ramal CMA (AAMA) on 11/24/2009   Method used:   Electronically to        [facility]* (retail)       [address]       [address] Shipping/mailing       [place], [place]  [address]       Ph: [phone]       Fax: [phone]   RxID:   [id number] FLAGYL 250 MG TABS (METRONIDAZOLE) Take 1 tablet by mouth three times a day x 7 days  #21 x 0   Entered by:   Hortense Ramal CMA (AAMA)   Authorized by:   Hart Carwin MD   Signed  by:   Hortense Ramal CMA Duncan Dull) on 11/24/2009   Method used:   Electronically to        [facility]* (retail)       [address]       [address] Shipping/mailing       [place], [place]  [address]       Ph: [phone]       Fax: [phone]   RxID:   [id number]

## 2010-11-01 NOTE — Assessment & Plan Note (Signed)
Summary: 8 WEEK F/U..AM.   History of Present Illness Visit Type: Follow-up Visit Primary GI MD: Lina Sar MD Primary Provider: Elizabeth Sauer, M.D. Requesting Provider: n/a Chief Complaint: Lower abd pain, rectal bleeding, and diarrhea History of Present Illness:   This is a 30 year old white female with severe Crohn's disease diagnosed in 2002 after presenting with an anal fistula.. She had a recent flareup of her Crohn's colitis in March 2011. Her last colonoscopy in May 2011showed active ileitis and active pancolitis. She has been on Entocort 9 mg daily, Asacol 4.8 g daily and methotrexate 25 mg IM weekly. She has been seen by Dr. 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 Dr. Nicholas Lose  suggested using another biological. Patient agrees with plans to start Cimzia, starting with an induction regimen in o, 2 and 4 weeks. She is currently having a lot of diarrhea and crampy abdominal pain. Her energy level has been very low. There is a history of anemia and iron deficiency.   GI Review of Systems    Reports abdominal pain.     Location of  Abdominal pain: lower abdomen.    Denies acid reflux, belching, bloating, chest pain, dysphagia with liquids, dysphagia with solids, heartburn, loss of appetite, nausea, vomiting, vomiting blood, weight loss, and  weight gain.      Reports diarrhea and  rectal bleeding.     Denies anal fissure, black tarry stools, change in bowel habit, constipation, diverticulosis, fecal incontinence, heme positive stool, hemorrhoids, irritable bowel syndrome, jaundice, light color stool, liver problems, and  rectal pain.    Current Medications (verified): 1)  Methotrexate Sodium 25 Mg/ml  Soln (Methotrexate Sodium) .... Inject 1 Ml Once A Week.  Discard Vial After 1 Injection. 2)  Folic Acid 1 Mg  Tabs (Folic Acid) .... Take 1 Tablet By Mouth Once A Day. 3)  Entocort Ec 3 Mg Xr24h-Cap (Budesonide) .... Take 3 Tablets By Mouth  Once Daily. 4)  Asacol 400 Mg  Tbec (Mesalamine) .... Take 4 Caps By Mouth 3 Times Daily. 5)  Lomotil 2.5-0.025 Mg  Tabs (Diphenoxylate-Atropine) .... Take 1 By Mouth Daily As Needed  Allergies (verified): No Known Drug Allergies  Past History:  Past Medical History: Reviewed history from 03/18/2008 and no changes required. 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 Unremarkable  Family History: Reviewed history from 03/19/2008 and no changes required. Family History of Juvenile Rhematoid Arthritis: First Cousin Family History of Diabetes: grandmother paternal  grandfather maternal No FH of Colon Cancer: Family History of Irritable Bowel Syndrome:uncle Father has gastric ulcers  Social History: Reviewed history from 03/19/2008 and no changes required. 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  Alcohol Use - yes Daily Caffeine Use  Review of Systems  The patient denies allergy/sinus, anemia, anxiety-new, arthritis/joint pain, back pain, blood in urine, breast changes/lumps, change in vision, confusion, cough, coughing up blood, depression-new, fainting, fatigue, fever, headaches-new, hearing problems, heart murmur, heart rhythm changes, itching, menstrual pain, muscle pains/cramps, night sweats, nosebleeds, pregnancy symptoms, shortness of breath, skin rash, sleeping problems, sore throat, swelling of feet/legs, swollen lymph glands, thirst - excessive , urination - excessive , urination changes/pain, urine leakage, vision changes, and voice change.         Pertinent positive and negative review of  systems were noted in the above HPI. All other ROS was otherwise negative.   Vital Signs:  Patient profile:   30 year  old female Height:      65 inches Weight:      116 pounds BMI:     19.37 BSA:     1.57 Pulse rate:   62 / minute Pulse rhythm:   regular BP sitting:   98 / 60  (left arm) Cuff size:   regular  Vitals Entered By: Ok Anis CMA (April 11, 2010 8:25 AM)  Physical Exam  General:  Well developed, well nourished, no acute distress. Eyes:  PERRLA, no icterus. Mouth:  No deformity or lesions, dentition normal. Neck:  Supple; no masses or thyromegaly. Lungs:  Clear throughout to auscultation. Heart:  Regular rate and rhythm; no murmurs, rubs,  or bruits. Abdomen:  soft scaphoid abdomen with normoactive bowel sounds. Mild to moderate tenderness in left lower and left middle quadrants without rebound or palpable mass. Right lower quadrant was normal, there is no distention. Extremities:  No clubbing, cyanosis, edema or deformities noted. Skin:  Intact without significant lesions or rashes. Psych:  Alert and cooperative. Normal mood and affect.   Impression & Recommendations:  Problem # 1:  CROHN'S DISEASE (ICD-555.9) Patient has Crohn's terminal ileitis and pancolitis as per a recent colonoscopy in May 2011. Patient is intolerant to 6-MP as it causes leukopenia. We will plan to start a Cimzia induction regimen followed by intramuscular injections every 4 weeks. She will be getting her PPD skin test today and will continue all other medications. We will obtain a CBC and hepatic function. I have asked her if she feels like she needs to reduce her workload but she will would prefer to continue to work full-time. Orders: T-Culture, C-Diff Toxin A/B ([id number]) TLB-CBC Platelet - w/Differential (85025-CBCD) TLB-Hepatic/Liver Function Pnl (80076-HEPATIC)  Problem # 2:  ANEMIA, HX OF (ICD-V12.3) check CBC today. Orders: T-Culture, C-Diff Toxin A/B ([id number]) TLB-CBC Platelet - w/Differential (85025-CBCD) TLB-Hepatic/Liver Function Pnl (80076-HEPATIC)  Problem # 3:  Hx of ANAL FISTULA  (ICD-565.1) This is not currently a problem.  Problem # 4:  PSORIASIS (ICD-696.1) Patient will follow up with Dr. Nicholas Lose if she develops a recurrence of psoriasis.  Other Orders: TB Skin Test [id number]) Admin 1st Vaccine (13086)  Patient Instructions: 1)  Please go to the basement for labwork before leaving the office today. You will have your CBC, Hepatic panel and a stool for C.Diff. 2)  We have placed a TB skin test on your forearm today. You will need to return to the office in 48-72 hours for a reading. 3)  Please schedule a follow-up appointment in 6 weeks.  4)  Copy sent to : Dr Elizabeth Sauer , Dr Venancio Poisson 5)  The medication list was reviewed and reconciled.  All changed / newly prescribed medications were explained.  A complete medication list was provided to the patient / caregiver.   Orders Added: 1)  T-Culture, C-Diff Toxin A/B [[id number]] 2)  TB Skin Test [86580] 3)  Admin 1st Vaccine [90471] 4)  TLB-CBC Platelet - w/Differential [85025-CBCD] 5)  TLB-Hepatic/Liver Function Pnl [80076-HEPATIC]    Immunizations Administered:  PPD Skin Test:    Vaccine Type: PPD    Site: left forearm    Mfr: Sanofi Pasteur    Dose: 0.1 ml    Route: ID    Given by: Lamona Curl CMA (AAMA)    Exp. Date: 07/15/2011    Lot #:  [id number]   Appended Document: 8 WEEK F/U..AM. TB skin test read today as negative.  0 mm  Darcey Nora RN, [facility]  April 13, 2010 7:56 AM    Clinical Lists Changes  Observations: Added new observation of TB-PPD: 0 mm (04/13/2010 7:55)

## 2010-11-01 NOTE — Progress Notes (Signed)
Summary: Condition update  Phone Note Call from Patient Call back at Home Phone [phone]   Caller: Patient Call For: Mike Gip Reason for Call: Talk to Nurse Summary of Call: Update: finished antibotic, 3-4 watery stools a day, no rectal bleeding, no temp. Initial call taken by: Karna Christmas,  July 25, 2010 12:38 PM  Follow-up for Phone Call        Called the pt back and had to leave a message.  I asked her to let me know if she is having pain.  She didn't mention it when she called today and left her message. Follow-up by: Joselyn Glassman,  July 25, 2010 1:56 PM  Additional Follow-up for Phone Call Additional follow up Details #1::        GOOD, SOUNDS LIKE SHE IS BETTER- MAKE SURE SHE HAS A FOLLOW UP WITH DR. Juanda Chance IN NEXT COUPLE WEEKS. Additional Follow-up by: Peterson Ao,  July 25, 2010 2:27 PM    Additional Follow-up for Phone Call Additional follow up Details #2::    Sheyann Sulton @ [phone] and let her know Amy thinks she should follow/up with Dr Juanda Chance. I put her in on 08-02-10 at 1:30 PM.  I Lm for her to call us 24-48 hours ahead of this appt if she cannot make it.  I did advise we do charge if the pt doesn't call us to cancel and doesn't come for the appt. Follow-up by: Joselyn Glassman,  July 26, 2010 10:44 AM  Additional Follow-up for Phone Call Additional follow up Details #3:: Details for Additional Follow-up Action Taken: [facility]

## 2010-11-01 NOTE — Medication Information (Signed)
Summary: Approved/[facility]  Approved/[employer] [facility]   Imported By: Lester Haynes 04/19/2010 10:06:53  _____________________________________________________________________  External Attachment:    Type:   Image     Comment:   External Document

## 2010-11-01 NOTE — Medication Information (Signed)
Summary: Cimzia Approved/[employer]  Cimzia Approved/[employer]   Imported By: Sherian Rein 04/18/2010 07:45:23  _____________________________________________________________________  External Attachment:    Type:   Image     Comment:   External Document

## 2010-11-01 NOTE — Progress Notes (Signed)
Summary: Triage-Crohn's Flare-up  Phone Note Call from Patient Call back at Home Phone [phone]   Caller: Patient Call For: Dr. Juanda Chance Reason for Call: Talk to Nurse Summary of Call: Crohn's Flare-up Initial call taken by: Karna Christmas,  January 14, 2010 11:17 AM  Follow-up for Phone Call        Pt. tapered off Entocort, last dose was 01-07-10. Now she c/o watery, urgent stools, has 6-8 stools daily, abd.pain, fever last night 101 and 99 this morning, nausea.  Denies blood, mucus.  Takes Asacol 4 caps three times a day and methotrexate.  DR.Meha Vidrine PLEASE ADVISE  Follow-up by: Laureen Ochs LPN,  January 14, 2010 12:00 PM  Additional Follow-up for Phone Call Additional follow up Details #1::        PER DR.Byrl Latin: 1) Begin Cipro 500mg  two times a day and Flagyl 250mg  three times a day, take both for 7 days. 2) C-Met and CBCD today 3) Clear liquids x24 hours, then full liquids X24 hours, then advance to soft,bland diet x2-4 days. Advanced as tolerated. 4) tylenol/Ibuprofen as needed 5)Call with an update Monday or call Dr.Jacobs over the weekend if symptoms get worse.  Above MD orders reviewed with patient. She has the Flagyl, new Cipro script sent to her pharmacy.  Additional Follow-up by: Laureen Ochs LPN,  January 14, 2010 1:13 PM    New/Updated Medications: CIPROFLOXACIN HCL 500 MG  TABS (CIPROFLOXACIN HCL) Take 1 twice a day times 7 days Prescriptions: CIPROFLOXACIN HCL 500 MG  TABS (CIPROFLOXACIN HCL) Take 1 twice a day times 7 days  #14 x 0   Entered by:   Laureen Ochs LPN   Authorized by:   Hart Carwin MD   Signed by:   Laureen Ochs LPN on 09/81/1914   Method used:   Electronically to        [facility]* (retail)       [address]       [address] Shipping/mailing       [place], [place]  [address]       Ph: [phone]       Fax: [phone]   RxID:   [id number]

## 2010-11-01 NOTE — Progress Notes (Signed)
Summary: TRIAGE  Phone Note Call from Patient Call back at Home Phone [phone]   Caller: Patient Call For: Juanda Chance Summary of Call: Patinet has a question about the Cimzia. Also, she complains of alot of dizziness and almost passed out at work. Initial call taken by: Harlow Mares CMA Duncan Dull),  April 18, 2010 11:41 AM  Follow-up for Phone Call        1) Per [facility], the nurse should be contacting the pt. within 48 hours. Pt. instructed to put med in the fridge until she sees the nurse.   2) Pt. states she told Dr.Wes Lezotte last week that she is feeling extremely exhausted lately. Today, she had an episode of being hot, flushed, nauseated, dizzy, "I felt like I was about to pass out." HR went from 89 to 116, no change in BP, lying and sitting.   DR.Keianna Signer PLEASE ADVISE  Follow-up by: Laureen Ochs LPN,  April 18, 2010 12:03 PM  Additional Follow-up for Phone Call Additional follow up Details #1::        I left a message for the pt. ?dehydration from diarrhea, or ? medication (Lomotil on Entecort).Her H/H was normal. Does she need to take sick leave for 1-2 weeks? I will write her excuse. Additional Follow-up by: Hart Carwin MD,  April 18, 2010 1:14 PM    Additional Follow-up for Phone Call Additional follow up Details #2::    Pt. doesn't have PAL time to take, so she cannot be out of work. Wants to know if she should see her PCP for the above symptoms?  DR.Takao Lizer PLEASE ADVISE. Follow-up by: Laureen Ochs LPN,  April 18, 2010 2:33 PM  Additional Follow-up for Phone Call Additional follow up Details #3:: Details for Additional Follow-up Action Taken: yes, she ought to see her PCP to assess her dizziness.  Additional Follow-up by: Hart Carwin MD,  April 18, 2010 4:03 PM    Above MD orders reviewed with patient. Pt. instructed to call back as needed. Laureen Ochs LPN  April 19, 2010 8:07 AM

## 2010-11-01 NOTE — Progress Notes (Signed)
Summary: Triage  Phone Note Call from Patient Call back at Home Phone [phone]   Caller: Patient Call For: Dr. Juanda Chance Reason for Call: Talk to Nurse Summary of Call: having alot of abd cramping, loose watery diarrhea 8x a day Initial call taken by: Karna Christmas,  June 22, 2010 11:33 AM  Follow-up for Phone Call        Left message for patient to call back Darcey Nora RN, [facility]  June 22, 2010 1:07 PM  Patient c/o continued diarrhea 6-8 watery BM daily, low grade fever, and abdominal pain. Cimzia nor Entocort doesn't seem to be helping.  No rectal bleeding, occasional nausea.  Please advise. Follow-up by: Darcey Nora RN, CGRN,  June 22, 2010 4:38 PM  Additional Follow-up for Phone Call Additional follow up Details #1::        Flare up of Crohn's discussed with the pt at length. She is at this point willint to take Prednisone. Please sent to [facility] Prednisone 20 mg, #100, take 1 1/2 ( 30 mg) by mouth q am, 2 refills. Pt will call me back with status update in 2 weeks. Additional Follow-up by: Hart Carwin MD,  June 22, 2010 6:19 PM    Additional Follow-up for Phone Call Additional follow up Details #2::    Rx sent to [facility] pharmacy Follow-up by: Darcey Nora RN, CGRN,  June 23, 2010 7:49 AM  New/Updated Medications: PREDNISONE 20 MG TABS (PREDNISONE) 1 1/2 tablets (30 mg ) q am Prescriptions: PREDNISONE 20 MG TABS (PREDNISONE) 1 1/2 tablets (30 mg ) q am  #100 x 2   Entered by:   Darcey Nora RN, CGRN   Authorized by:   Hart Carwin MD   Signed by:   Darcey Nora RN, CGRN on 06/23/2010   Method used:   Electronically to        [facility]* (retail)       [address]       [address] Shipping/mailing       [place], [place]  [address]       Ph: [phone]       Fax: [phone]   RxID:   [id number]

## 2010-11-01 NOTE — Consult Note (Signed)
Summary: [facility]   Imported By: Lester [place] 04/13/2010 10:01:14  _____________________________________________________________________  External Attachment:    Type:   Image     Comment:   External Document

## 2010-11-01 NOTE — Procedures (Signed)
Summary: Colonoscopy  Patient: Cynthia [name] Note: All result statuses are Final unless otherwise noted.  Tests: (1) Colonoscopy (COL)   COL Colonoscopy           DONE     [facility]     [address]     [place], [place]  [address]           COLONOSCOPY PROCEDURE REPORT           PATIENT:  Cynthia, [name]  MR#:  [id number]     BIRTHDATE:  Dec 12, 1980, 28 yrs. old  GENDER:  female     ENDOSCOPIST:  Hedwig Morton. Juanda Chance, MD     REF. BY:  Elizabeth Sauer, M.D.     PROCEDURE DATE:  02/08/2010     PROCEDURE:  Colonoscopy 66440     ASA CLASS:  Class I     INDICATIONS:  Crohn's colitis, exacerbation,     last colon 4-5 years ago at [facility], she is on Methotrexate,     entecort Asacal 4.8 gm/day     MEDICATIONS:   Versed 10 mg, Fentanyl 100 mcg           DESCRIPTION OF PROCEDURE:   After the risks benefits and     alternatives of the procedure were thoroughly explained, informed     consent was obtained.  Digital rectal exam was performed and     revealed no rectal masses.   The LB [id number]-[id number] endoscope     was introduced through the anus and advanced to the terminal ileum     which was intubated for a short distance, without limitations.     The quality of the prep was good, using MiraLax.  The instrument     was then slowly withdrawn as the colon was fully examined.     <<PROCEDUREIMAGES>>           FINDINGS:  There were mucosal changes consistent with Crohn's     disease. diffuce moderately severe pancolitis, less severe in the     rectum, many pseudopolyps left colon, no discrete ulcers,     ileocecal valve is edematous Multiple biopsies were obtained and     sent to pathology (see image2, image3, image4, image5, image6,     image7, image8, image10, image11, and image12).  There were     inflammatory changes in the ileum consistent wih Crohn's disease.     Multiple biopsies were obtained and sent to pathology (see     image9).  normal rectum (see image13).   Retroflexed views  in the     rectum revealed no abnormalities.    The scope was then withdrawn     from the patient and the procedure completed.           COMPLICATIONS:  None     ENDOSCOPIC IMPRESSION:     1) Colitis - Crohn's     2) Ileitis - Crohn's     3) Normal rectum     moderately severe Crohn's pancolitis, and ileitis, s/p biopsies           RECOMMENDATIONS:     1) Await biopsy results     discuss switching to Prednisone,     consider biologicals     she was unable to tolerate Imuran     REPEAT EXAM:  In 5 year(s) for.           ______________________________     Hedwig Morton. Juanda Chance, MD  CC:           n.     eSIGNED:   Jing Howatt M. Raquon Milledge at 02/08/2010 02:04 PM           Page 2 of 3   Viviano Simas, [id number]  Note: An exclamation mark (!) indicates a result that was not dispersed into the flowsheet. Document Creation Date: 02/08/2010 2:05 PM _______________________________________________________________________  (1) Order result status: Final Collection or observation date-time: 02/08/2010 13:51 Requested date-time:  Receipt date-time:  Reported date-time:  Referring Physician:   Ordering Physician: Lina Sar [phone]) Specimen Source:  Source: Cynthia [name] Order Number: [id number] Lab site:   Appended Document: Colonoscopy     Procedures Next Due Date:    Colonoscopy: 01/2015

## 2010-11-01 NOTE — Progress Notes (Signed)
Summary: Medication  Phone Note Call from Patient Call back at Home Phone [phone]   Caller: Patient Call For: Dr. Juanda Chance Reason for Call: Talk to Nurse Summary of Call: Needs prior auth. for her Cimzia Initial call taken by: Karna Christmas,  June 22, 2010 11:36 AM  Follow-up for Phone Call        Prior authorization in process. Dottie Nelson-Smith CMA Duncan Dull)  June 23, 2010 4:51 PM   Additional Follow-up for Phone Call Additional follow up Details #1::        Medication approved through [facility] until 06/2011. See scanned Document. Additional Follow-up by: Lamona Curl CMA (AAMA),  June 27, 2010 8:21 AM

## 2010-11-01 NOTE — Medication Information (Signed)
Summary: Approved Cimzia [employer]  Approved Cimzia [employer]   Imported By: Lennie Odor 06/28/2010 15:36:18  _____________________________________________________________________  External Attachment:    Type:   Image     Comment:   External Document

## 2010-11-01 NOTE — Letter (Signed)
Summary: Patient Notice- Colon Biospy Results  [facility]  [address] 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  Please follow-up with      your primary care physician for your other healthcare needs.  _x_Please call [phone] to schedule a return visit to review      your condition.  _x_Continue with the treatment plan as outlined on the day of your      exam.  x__You should have a repeat colonoscopy examination for this problem           in _5 years.  Please call us if you are having persistent problems or have questions about your condition that have not been fully answered at this time.  Sincerely,  Hart Carwin MD   This letter has been electronically signed by your physician.  Appended Document: Patient Notice- Colon Biospy Results letter mailed  Appended Document: Patient Notice- Colon Biospy Results MAILED LETTER WITH PATIENTS P.O BOX . FIRST LETTER RETURNED.

## 2010-11-01 NOTE — Miscellaneous (Signed)
Summary: Cimzia  Clinical Lists Changes  Medications: Added new medication of CIMZIA 2 X 200 MG/ML KIT (CERTOLIZUMAB PEGOL) 1 injection Q month.

## 2010-11-03 NOTE — Progress Notes (Signed)
Summary: rx refill  Phone Note From Pharmacy Call back at [phone]   Caller: Robin Pharmacist Call For: Dr Juanda Chance  Summary of Call: Patient needs rx refill for folic acid 1 mg states that she has been faxing this order since 12-23 and has not received any response. Initial call taken by: Tawni Levy,  September 29, 2010 9:25 AM  Follow-up for Phone Call        Advised pharmacist I have not received any requests. She states she has been faxing to [phone]. Asked her to change fax to [phone]. Per [facility] [place] records, patient recommended to start back on methotrexate and folic acid. I will send refill. Follow-up by: Lamona Curl CMA Duncan Dull),  September 29, 2010 9:31 AM    New/Updated Medications: FOLIC ACID 1 MG TABS (FOLIC ACID) Take 1 tablet by mouth once a day Prescriptions: FOLIC ACID 1 MG TABS (FOLIC ACID) Take 1 tablet by mouth once a day  #100 x 0   Entered by:   Lamona Curl CMA (AAMA)   Authorized by:   Hart Carwin MD   Signed by:   Lamona Curl CMA (AAMA) on 09/29/2010   Method used:   Electronically to        [facility] (retail)       [address]       [place], [place]  [address]       Ph: [phone]       Fax: [phone]   RxID:   [id number]

## 2010-11-03 NOTE — Miscellaneous (Signed)
Summary: Cipro Rx  ---- 10/17/2010 3:05 PM, Karen Kitchens Nelson-Smith CMA (AAMA) wrote: I have a refill request for this patient to get Cipro 250 three times a day.Marland KitchenMarland KitchenMarland KitchenI see in our office note from 08/02/10 that she was to take Flagyl three times a day x 1 week then decrease to twice daily dosing...Marland KitchenMarland KitchenWas this something she is to have?  ---- 10/17/2010 4:45 PM, Hart Carwin MD wrote: it is an ongoing medication which she alternates. OK to refill.  Clinical Lists Changes  Medications: Changed medication from FLAGYL 250 MG TABS (METRONIDAZOLE) Take 1 tablet by mouth three times a day x 1 week, then decrease to 1 tablet by mouth two times a day to FLAGYL 250 MG TABS (METRONIDAZOLE) Take 1 tablet by mouth two times a day - Signed Rx of FLAGYL 250 MG TABS (METRONIDAZOLE) Take 1 tablet by mouth two times a day;  #60 x 1;  Signed;  Entered by: Lamona Curl CMA (AAMA);  Authorized by: Hart Carwin MD;  Method used: Electronically to [facility] [address] [place], [place], [place]  [address], Ph: [phone], Fax: [phone]    Prescriptions: FLAGYL 250 MG TABS (METRONIDAZOLE) Take 1 tablet by mouth two times a day  #60 x 1   Entered by:   Lamona Curl CMA (AAMA)   Authorized by:   Hart Carwin MD   Signed by:   Lamona Curl CMA (AAMA) on 10/17/2010   Method used:   Electronically to        [facility] (retail)       [address]       [place], [place]  [address]       Ph: [phone]       Fax: [phone]   RxID:   [id number]

## 2011-01-03 ENCOUNTER — Emergency Department: Payer: Self-pay | Admitting: Emergency Medicine

## 2011-01-11 LAB — URINE CULTURE
Colony Count: NO GROWTH
Culture: NO GROWTH

## 2011-01-11 LAB — DIFFERENTIAL
Basophils Absolute: 0.1 10*3/uL (ref 0.0–0.1)
Basophils Relative: 2 % — ABNORMAL HIGH (ref 0–1)
Blasts: 0 %
Lymphocytes Relative: 46 % (ref 12–46)
Lymphs Abs: 2 10*3/uL (ref 0.7–4.0)
Myelocytes: 0 %
Neutro Abs: 2.1 10*3/uL (ref 1.7–7.7)
Neutrophils Relative %: 47 % (ref 43–77)
Promyelocytes Absolute: 0 %

## 2011-01-11 LAB — COMPREHENSIVE METABOLIC PANEL
ALT: 508 U/L — ABNORMAL HIGH (ref 0–35)
Alkaline Phosphatase: 150 U/L — ABNORMAL HIGH (ref 39–117)
BUN: 8 mg/dL (ref 6–23)
CO2: 26 mEq/L (ref 19–32)
Chloride: 102 mEq/L (ref 96–112)
Glucose, Bld: 95 mg/dL (ref 70–99)
Potassium: 3.7 mEq/L (ref 3.5–5.1)
Sodium: 136 mEq/L (ref 135–145)
Total Bilirubin: 0.7 mg/dL (ref 0.3–1.2)

## 2011-01-11 LAB — URINALYSIS, ROUTINE W REFLEX MICROSCOPIC
Hgb urine dipstick: NEGATIVE
Protein, ur: 30 mg/dL — AB
Specific Gravity, Urine: 1.025 (ref 1.005–1.030)
Urobilinogen, UA: 1 mg/dL (ref 0.0–1.0)

## 2011-01-11 LAB — CBC
HCT: 42.8 % (ref 36.0–46.0)
Hemoglobin: 14.3 g/dL (ref 12.0–15.0)
MCHC: 33.4 g/dL (ref 30.0–36.0)
RDW: 14.5 % (ref 11.5–15.5)

## 2011-01-11 LAB — POCT PREGNANCY, URINE: Preg Test, Ur: NEGATIVE

## 2011-01-11 LAB — URINE MICROSCOPIC-ADD ON

## 2011-01-11 LAB — CULTURE, BLOOD (ROUTINE X 2)

## 2011-02-14 NOTE — Consult Note (Signed)
NAME:  Cynthia, [name] NO.:  [id number]   MEDICAL RECORD NO.:  [id number]          PATIENT TYPE:  EMS   LOCATION:  MAJO                         FACILITY:  [facility]   PHYSICIAN:  Cynthia Morton. Juanda Chance, MD     DATE OF BIRTH:  [birth year]   DATE OF CONSULTATION:  DATE OF DISCHARGE:  12/31/2008                                 CONSULTATION   REASON FOR CONSULTATION:  Elevated LFTs.   CHIEF COMPLAINT:  Fevers.   HISTORY OF PRESENT ILLNESS:  Cynthia [name] is a pleasant 30 year old white  female.  She has a history of Crohn disease which was originally  diagnosed in June 2000.  She has had complications of anal fistula.  She  was initially treated at [facility], but in the summer of 2009  switched her care to Cynthia [name].  Crohn's wise, she has been stable  on weekly methotrexate shots.  She no longer uses any mesalamine  products, so in the past she has used Lialda.  She has also been treated  with Remicade and Humira in the past when she was at [facility].  Also noted in  November office visit, Flagyl and Cipro were being used for this  patient.  She has not used Lialda for at least 6 months.  The patient  has had routine liver function tests checked in November 2009, December  2009, February 2010, and on December 25, 2008.  At all of these checks, her  LFTs were normal.   The patient developed myalgias, fevers, night sweats, chills and  headache over this past weekend which is 5 days ago.  On December 29, 2008,  she went to see her primary care physician, Cynthia [name], a flu screen and  urinalysis were negative.  There was some thought that she might have a  sinus infection as she was having headaches and she began treatment with  Levaquin 500 mg daily that Tuesday night.  Fevers persisted.  She was  taking Tylenol about 3 grams daily for the last several days to control  fevers, however, she would spike temps of 102 and 103 at night.  The  myalgias and headaches and general sense of  malaise did improve.  However, she recontacted Cynthia [name], as advised, because of fevers did  not resolve and she was sent to the emergency room where she was  evaluated today.   Here at the emergency room, she has a positive monospot test suggesting  acute mononucleosis and her transaminases, previously normal less than a  week ago, have an AST of about 650 and an ALT of about 500.  She is not  having abdominal pain, pruritus, nausea or vomiting.  Her appetite is  somewhat decreased as is her energy level.   Cynthia [name] was contacted by Cynthia [name] of the ER staff for evaluation of  the LFT abnormalities and management of these.   PAST MEDICAL HISTORY:  1. Crohn disease diagnosed in June 2000 with complication of anal      fistula in 2006.  2. History of anemia.  3.  History of exercise-induced asthma.  4. History of psoriasis.  5. Prior history of 6-TGN levels of 80.  6. History of yeast infections.  7. She has osteopenia, I believe.   MEDICATIONS:  1. Methotrexate 25 mg/mL 1 mL delivered intramuscularly once weekly,      she usually takes this on Thursday or Friday.  2. Folic acid 1 mg daily.  3. Levaquin 500 mg daily, began on December 29, 2008.   ALLERGIES:  She is intolerant to 6-MP.   FAMILY HISTORY:  Grandparents who are diabetics and an uncle who has  IBS, a cousin who has juvenile rheumatoid arthritis, and father has a  history of gastric ulcers.   REVIEW OF SYSTEMS:  CONSTITUTIONAL:  No weight loss.  VISUAL:  No blurry  vision, no dry eyes.  ENT:  She has had scant amount of blood when  blowing her nose for the past few days and this is not normal for her,  but this is not epistaxis where blood is dripping from her nose and she  needs to hold pressure on the nose.  CARDIOVASCULAR:  No chest pain or  palpitations.  RESPIRATORY:  No shortness of breath, no cough.  GI:  Bowel movements were formed.  There has been no rectal discharge.  She  does not have abdominal pain.   GENITOURINARY:  No dysuria, no tea-  colored urine.  MUSCULOSKELETAL:  Myalgias have diminished in the last  few days.  DERMATOLOGIC:  No rashes.  No sores.  NEUROLOGIC:  Has had  some headaches, but these are diminishing over the last few days as  well.  PSYCHIATRIC:  No depression.  ENDOCRINE:  No excessive thirst.  No history of elevated blood glucose.  HEMATOLOGIC:  No unusual bleeding  or bruising other than the small amount of nose bleed.  All other  systems reviewed and negative.   PREVIOUS EGD/COLON/CAPSULE STUDY:  Last colonoscopy was in 2006 at  [facility], do not have the findings from that study.   LABORATORY DATA:  Total bilirubin 0.6, alkaline phosphatase 54, AST 21,  ALT 21 on December 25, 2008.  Today, the total bilirubin is 0.7, alkaline  phosphatase 150, AST 651, and ALT 508.  Albumin is 3.6.  Sodium 136,  potassium 3.7, BUN 8, creatinine 0.8, and glucose 95.  Hemoglobin 14.3,  hematocrit 42.8, white blood cell count 4.4, platelets 107, and MCV 85.   X-RAYS/IMAGING:  Ultrasound on August 19, 2008 completed secondary to  prior history of elevated LFTs, this study was normal.  Chest x-ray of  today shows no acute disease.   PHYSICAL EXAMINATION:  GENERAL:  The patient is a pleasant, well-  appearing white female in no distress.  VITAL SIGNS:  Temperature is 100, pulse 92, blood pressure 105/67,  respirations 16, do not have a current weight.  EYES:  There is no icterus, no pallor, and extraocular movements are  intact.  ENT:  Mucosa is moist and clear.  NECK:  There is no masses, no JVD.  LUNGS:  Chest is clear to auscultation and percussion bilaterally, but  the breath sounds are slightly decreased in the right lower lobe.  HEART:  There is regular rate and rhythm.  No murmurs, rubs, or gallops.  ABDOMEN:  Soft, nontender, nondistended with active bowel sounds.  No  masses, no hepatosplenomegaly, no bruits.  EXTREMITIES:  No cyanosis, clubbing, or edema.   DERMATOLOGIC:  No jaundice, no rash.  NEUROLOGIC:  She is alert and oriented  x3, excellent historian, strength  of the upper and lower extremities is full and there is no tremor.   IMPRESSION:  1. Acute hepatitis with multiple possible causes including the acute      mononucleosis as well as medications either separately or in      combination including acute consumption of large quantities of      Tylenol, the recent use of Levaquin, and these in addition to      chronic methotrexate which probably puts her at some vulnerability      for drug-induced hepatitis.  The methotrexate has been a chronic      medication and since November, she has had 3-4 separate checks of      her LFTs and they were always normal, and one of these was just 4      days ago.  So, the methotrexate in on of itself is unlikely to be      causing this acute hepatitis.  2. History of Crohn disease which is quiescent.  3. Acute mononucleosis.  4. Thrombocytopenia.  The platelets were within normal limits at 196,      December 25, 2008, so this is likely secondary to the acute      mononucleosis.   PLAN:  1. The patient is well enough to go home, but she is aware that she is      to return to the emergency room or at least call the GI doctor on-      call if jaundice develops, she becomes more acutely ill, or if she      develops severe abdominal pain.  She is to stop Tylenol and      Levaquin.  For relief of symptoms such as fevers or any myalgias,      she can use ibuprofen in small quantities up to 600 mg daily.  2. She is to go to the lab at the [place] office on Monday, January 04, 2009, where she will have her LFTs checked and she probably is      going to need to have her LFTs checked periodically every few weeks      thereafter until the LFTs normalize.  3. For now, she is instructed to hold her methotrexate per 2 weeks and      await further instructions as to resumption of methotrexate from      Dr.  Juanda [name].  4. Diet.  She can eat whatever she cares to eat but she is to push      fluids given that she has these fevers.      Jennye Moccasin, PA-C      Cynthia Morton. Juanda Chance, MD  Electronically Signed    SG/MEDQ  D:  12/31/2008  T:  01/01/2009  Job:  [id number]   cc:   Elizabeth Sauer, M.D.

## 2011-02-14 NOTE — Consult Note (Signed)
NAME:  Cynthia [name], Cynthia [name] NO.:  [id number]   MEDICAL RECORD NO.:  [id number]          PATIENT TYPE:  EMS   LOCATION:  [facility]JO                         FACILITY:  [facility]   PHYSICIAN:  Rachael Fee, MD   DATE OF BIRTH:  08/01/1981   DATE OF CONSULTATION:  12/31/2008  DATE OF DISCHARGE:  12/31/2008                                 CONSULTATION   ADDENDUM   The patient states that she did not take her methotrexate shot this  week.  Also, pending at the time of this dictation are a check of her  PT/INR; however, we are still going to discharge her before this lab  becomes available as she looks to be in good health.  She is aware that  she is to come back to the [facility] this coming Monday for lab work  and the lab work has been arranged through the office lab.      Jennye Moccasin, PA-C      Rachael Fee, MD  Electronically Signed    SG/MEDQ  D:  12/31/2008  T:  01/01/2009  Job:  [id number]

## 2011-04-10 ENCOUNTER — Telehealth: Payer: Self-pay | Admitting: *Deleted

## 2011-04-10 NOTE — Telephone Encounter (Signed)
Per patient, she has switched her GI care to [facility], therefore, she does not need to get PPD test from [place].

## 2011-12-11 ENCOUNTER — Encounter (HOSPITAL_COMMUNITY): Payer: Self-pay

## 2011-12-11 ENCOUNTER — Emergency Department (HOSPITAL_COMMUNITY): Payer: 59

## 2011-12-11 ENCOUNTER — Emergency Department (HOSPITAL_COMMUNITY)
Admission: EM | Admit: 2011-12-11 | Discharge: 2011-12-11 | Disposition: A | Discharge status: Home / Self Care | Payer: 59 | Attending: Emergency Medicine | Admitting: Emergency Medicine

## 2011-12-11 DIAGNOSIS — R059 Cough, unspecified: Secondary | ICD-10-CM | POA: Insufficient documentation

## 2011-12-11 DIAGNOSIS — M940 Chondrocostal junction syndrome [Tietze]: Secondary | ICD-10-CM | POA: Insufficient documentation

## 2011-12-11 DIAGNOSIS — R079 Chest pain, unspecified: Secondary | ICD-10-CM | POA: Insufficient documentation

## 2011-12-11 DIAGNOSIS — R05 Cough: Secondary | ICD-10-CM | POA: Insufficient documentation

## 2011-12-11 DIAGNOSIS — K5090 Crohn's disease, unspecified, without complications: Secondary | ICD-10-CM | POA: Insufficient documentation

## 2011-12-11 HISTORY — DX: Crohn's disease, unspecified, without complications: K50.90

## 2011-12-11 MED ORDER — TRAMADOL HCL 50 MG PO TABS: 50.0000 mg | ORAL_TABLET | Freq: Four times a day (QID) | ORAL | Status: AC | PRN

## 2011-12-11 NOTE — ED Notes (Signed)
Patient transported to X-ray 

## 2011-12-11 NOTE — ED Notes (Signed)
Cough for 5 weeks was coughing this am and felt something pop under her lt. Rib area.  Now is having severe pain

## 2011-12-11 NOTE — ED Notes (Signed)
Pt finished abx course this am for unconfirmed pertussis. States cough since first week of feb.

## 2011-12-11 NOTE — ED Provider Notes (Signed)
History     CSN: [id number]  Arrival date & time 12/11/11  4098   First MD Initiated Contact with Patient 12/11/11 [phone]      Chief Complaint  Patient presents with  . Cough    (Consider location/radiation/quality/duration/timing/severity/associated sxs/prior treatment) HPI Comments: Patient reports that the she has had a non-productive cough for the past 5 weeks.  She has been on Augmentin, Amoxicillin, and more recently Clarithromycin for her symptoms.  She is currently on day 7 of Clarithromycin.  Antibiotics have been prescribed by PCP.  Patient reports that her PCP felt that she may have Pertussis.  She reports that today when she coughed she developed severe pain of her left rib right below her left breast.  She reports that she has had rib pain for several days, but became more severe today.  Patient is a 31 y.o. female presenting with cough. The history is provided by the patient.  Cough Pertinent negatives include no chills, no rhinorrhea, no sore throat, no shortness of breath and no wheezing. She has tried nothing for the symptoms. She is not a smoker.    Past Medical History  Diagnosis Date  . Crohn disease     History reviewed. No pertinent past surgical history.  No family history on file.  History  Substance Use Topics  . Smoking status: Never Smoker   . Smokeless tobacco: Not on file  . Alcohol Use:     OB History    Grav Para Term Preterm Abortions TAB SAB Ect Mult Living                  Review of Systems  Constitutional: Negative for fever and chills.  HENT: Negative for congestion, sore throat, rhinorrhea, neck pain, neck stiffness and sinus pressure.   Respiratory: Positive for cough. Negative for chest tightness, shortness of breath and wheezing.   Cardiovascular: Negative for palpitations.  Gastrointestinal: Negative for abdominal pain.  Skin: Negative for color change, rash and wound.  Neurological: Negative for dizziness, syncope and  light-headedness.    Allergies  Almond meal and Soy allergy  Home Medications   Current Outpatient Rx  Name Route Sig Dispense Refill  . CLARITHROMYCIN PO Oral Take 1 tablet by mouth 2 (two) times daily. Finished 7 day course today    . EPINEPHRINE 0.3 MG/0.3ML IJ DEVI Intramuscular Inject 0.3 mg into the muscle once.    Marland Kitchen FOLIC ACID 1 MG PO TABS Oral Take 1 mg by mouth daily.    Marland Kitchen METHOTREXATE SODIUM CHEMO INJECTION 25 MG/ML Intravenous Inject 25 mg into the vein every 7 (seven) days. Mondays      BP 118/68  Pulse 90  Temp(Src) 98 F (36.7 C) (Oral)  Resp 16  Ht 5\' 5"  (1.651 m)  Wt 117 lb (53.071 kg)  BMI 19.47 kg/m2  SpO2 100%  LMP 11/24/2011  Physical Exam  Nursing note and vitals reviewed. Constitutional: She is oriented to person, place, and time. She appears well-developed and well-nourished. No distress.  Neck: Normal range of motion. Neck supple.  Cardiovascular: Normal rate, regular rhythm and normal heart sounds.   Pulmonary/Chest: Effort normal and breath sounds normal. No accessory muscle usage. Not tachypneic. No respiratory distress. She has no decreased breath sounds. She has no wheezes. She has no rales. She exhibits tenderness.       Tenderness to palpation of the left rib below the left breast.  No obvious deformity  Musculoskeletal: Normal range of motion.  Neurological: She  is alert and oriented to person, place, and time.  Skin: Skin is warm and dry. She is not diaphoretic. No erythema.  Psychiatric: She has a normal mood and affect.    ED Course  Procedures (including critical care time)  Labs Reviewed - No data to display Dg Ribs Unilateral W/chest Left  12/11/2011  *RADIOLOGY REPORT*  Clinical Data: Cough, left rib pain  LEFT RIBS AND CHEST - 3+ VIEW  Comparison: None.  Findings: Lungs are clear. No pleural effusion or pneumothorax.  Cardiomediastinal silhouette is within normal limits.  No left rib fracture is seen.  IMPRESSION: No evidence of  acute cardiopulmonary disease.  No left rib fracture is seen.  Original Report Authenticated By: Charline Bills, M.D.     1. Costochondritis       MDM  Patient with five weeks of coughing comes in today with left sided rib pain.  Pain worse with taking deep breath.  Patient with negative CXR.  Feel that symptoms are most likely costochondritis.          Pascal Lux [place], PA-C 12/11/11 1932

## 2011-12-11 NOTE — Discharge Instructions (Signed)
Costochondritis Costochondritis is a condition in which the tissue (cartilage) that connects your ribs with your breastbone (sternum) becomes irritated and causes chest pain.  HOME CARE  Avoid activities that wear you out.   Do not strain your ribs. Avoid activities that use your:   Chest.   Belly.   Side muscles.   Put ice on the area.   Put ice in a plastic bag.   Place a towel betwen your skin and the bag.   Leave the ice on for 15 to 20 minutes, 3 to 4 times a day.   Only take medicine as told by your doctor.  GET HELP RIGHT AWAY IF:   Your pain gets worse.   You are very uncomfortable.   You have a fever.   You have trouble breathing.   You cough up blood.   You start sweating or throwing up (vomiting).   You develop new, unexplained symptoms.  MAKE SURE YOU:   Understand these instructions.   Will watch your condition.   Will get help right away if you are not doing well or get worse.  Document Released: 03/06/2008 Document Revised: 09/07/2011 Document Reviewed: 03/06/2008 [facility] Patient Information 2012 [place], [place].Costochondritis Costochondritis (Tietze syndrome), or costochondral separation, is a swelling and irritation (inflammation) of the tissue (cartilage) that connects your ribs with your breastbone (sternum). It may occur on its own (spontaneously), through damage caused by an accident (trauma), or simply from coughing or minor exercise. It may take up to 6 weeks to get better and longer if you are unable to be conservative in your activities. HOME CARE INSTRUCTIONS   Avoid exhausting physical activity. Try not to strain your ribs during normal activity. This would include any activities using chest, belly (abdominal), and side muscles, especially if heavy weights are used.   Use ice for 15 to 20 minutes per hour while awake for the first 2 days. Place the ice in a plastic bag, and place a towel between the bag of ice and your skin.   Only  take over-the-counter or prescription medicines for pain, discomfort, or fever as directed by your caregiver.  SEEK IMMEDIATE MEDICAL CARE IF:   Your pain increases or you are very uncomfortable.   You have a fever.   You develop difficulty with your breathing.   You cough up blood.   You develop worse chest pains, shortness of breath, sweating, or vomiting.   You develop new, unexplained problems (symptoms).  MAKE SURE YOU:   Understand these instructions.   Will watch your condition.   Will get help right away if you are not doing well or get worse.  Document Released: 06/28/2005 Document Revised: 09/07/2011 Document Reviewed: 05/06/2008 [facility] Patient Information 2012 [place], [place].

## 2011-12-12 NOTE — ED Provider Notes (Signed)
Medical screening examination/treatment/procedure(s) were performed by non-physician practitioner and as supervising physician I was immediately available for consultation/collaboration.  Raeford Razor, MD 12/12/11 [phone]

## 2012-04-13 IMAGING — CT CT ABD-PELV W/ CM
2 of 4 series · 17 of 46 positions shown, 19 images · IV contrast (Omnipaque 300)
Comparison: None.

CLINICAL DATA: Pelvic pain and nausea.  History of Crohn's disease.
Lower abdominal tenderness.

CT ABDOMEN AND PELVIS WITH CONTRAST
TECHNIQUE: Multidetector CT imaging of the abdomen and pelvis was
performed following the standard protocol during bolus
administration of intravenous contrast.
Contrast: 100 ml Omnipaque 300

[Series 2: abd/ pel 5mm · axial · 0.70mm/px · z∈[-423,-13]mm · 14 of 90 slices shown, 16 images]
[im 4/90  soft-tissue]
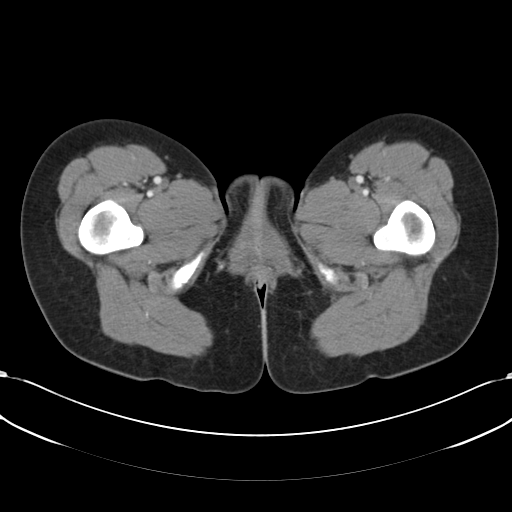
[im 4/90  bone]
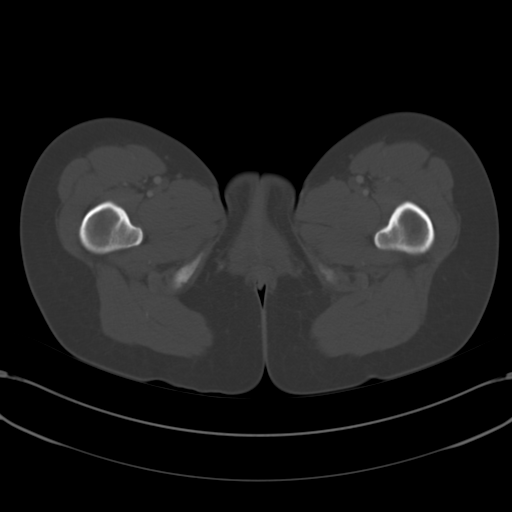
[im 11/90  soft-tissue]
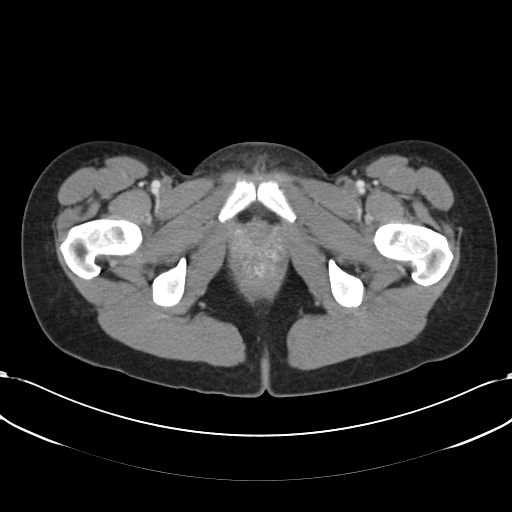
[im 18/90  soft-tissue]
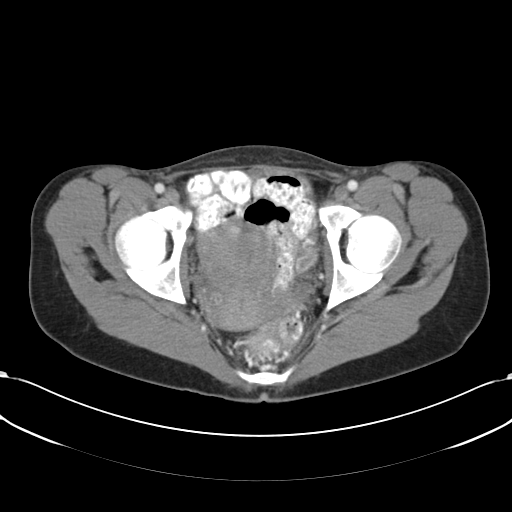
[im 25/90  soft-tissue]
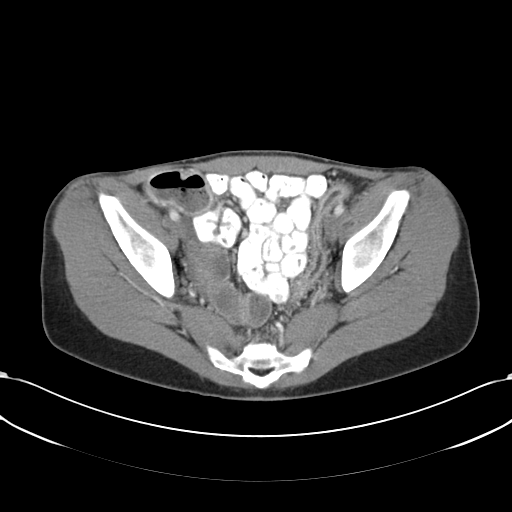
[im 29/90  soft-tissue]
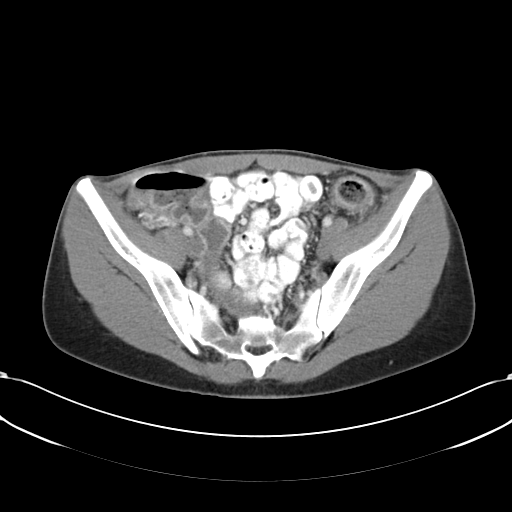
[im 36/90  soft-tissue]
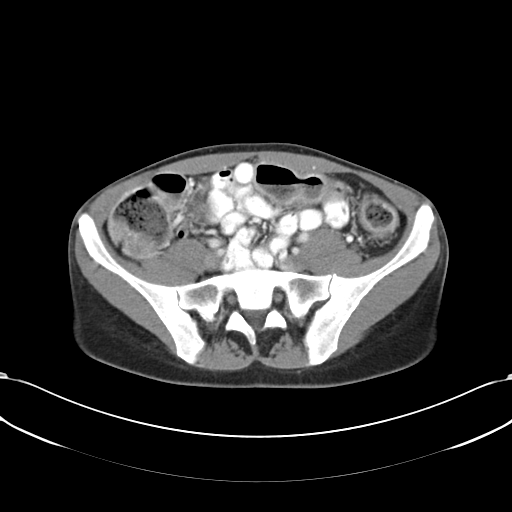
[im 43/90  soft-tissue]
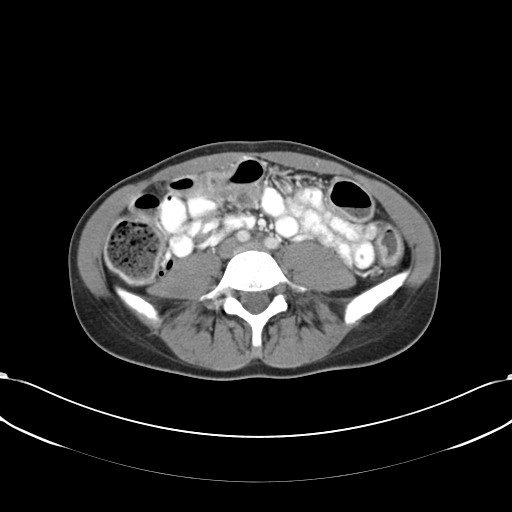
[im 47/90  soft-tissue]
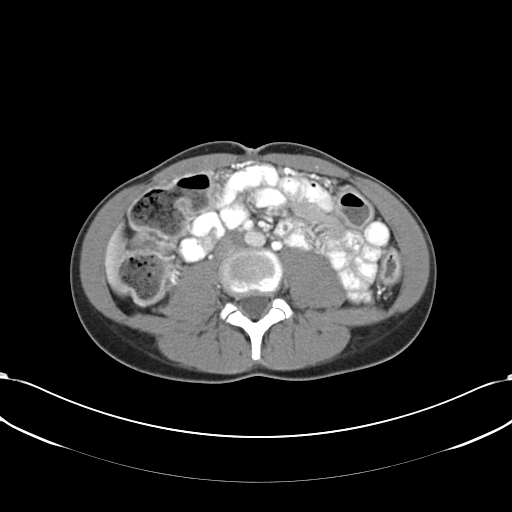
[im 54/90  soft-tissue]
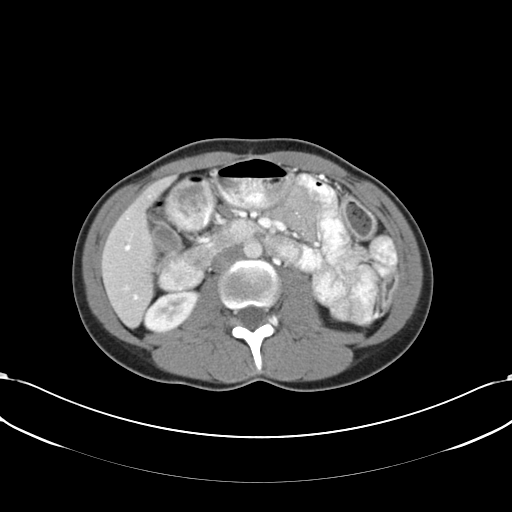
[im 54/90  bone]
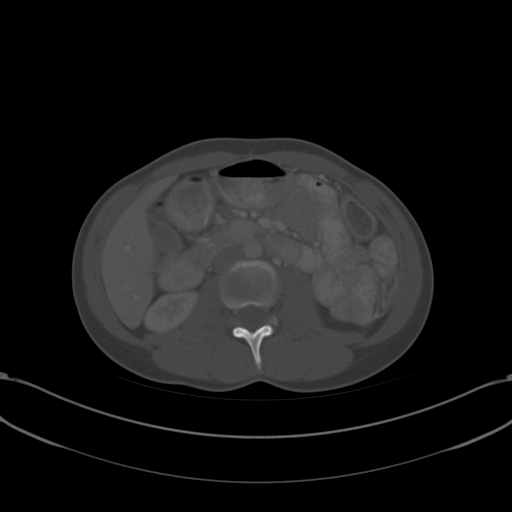
[im 61/90  soft-tissue]
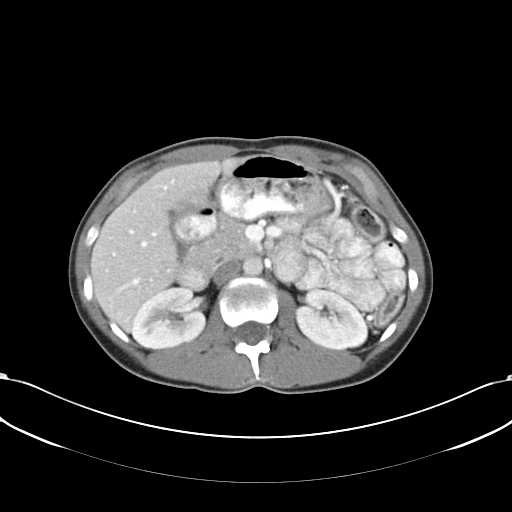
[im 68/90  soft-tissue]
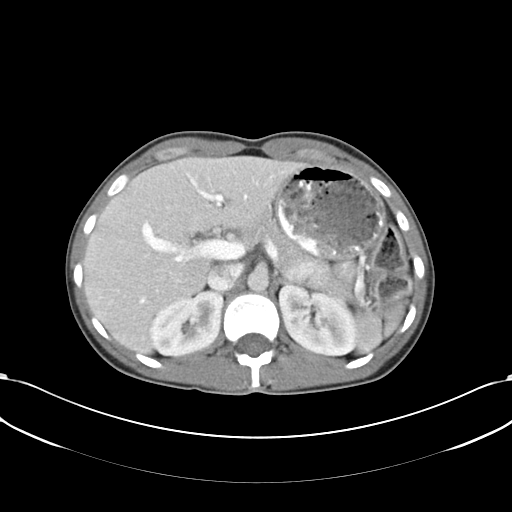
[im 72/90  soft-tissue]
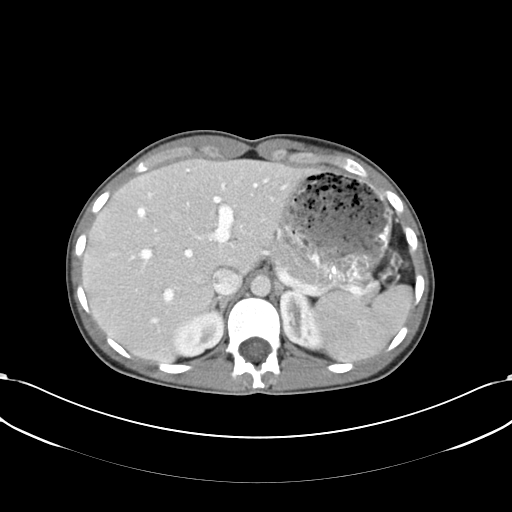
[im 79/90  soft-tissue]
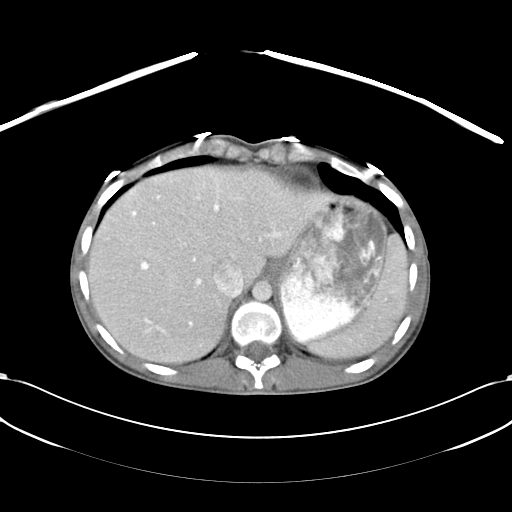
[im 86/90  soft-tissue]
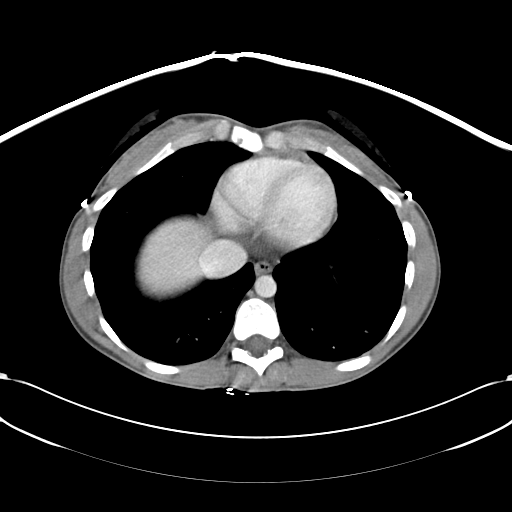

[Series 602: <mpr range> · coronal · 0.88mm/px · 3 of 91 slices shown]
[im 31/91  soft-tissue]
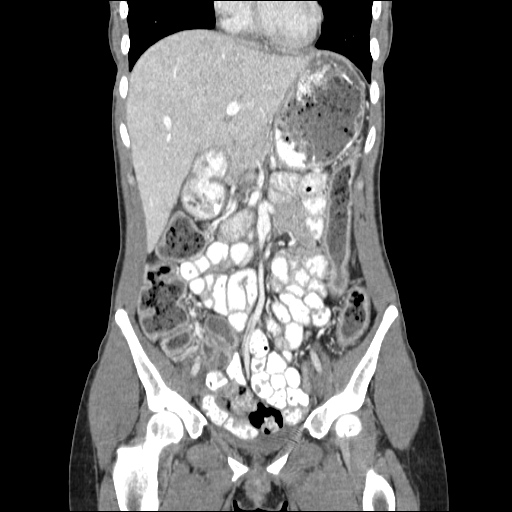
[im 41/91  soft-tissue]
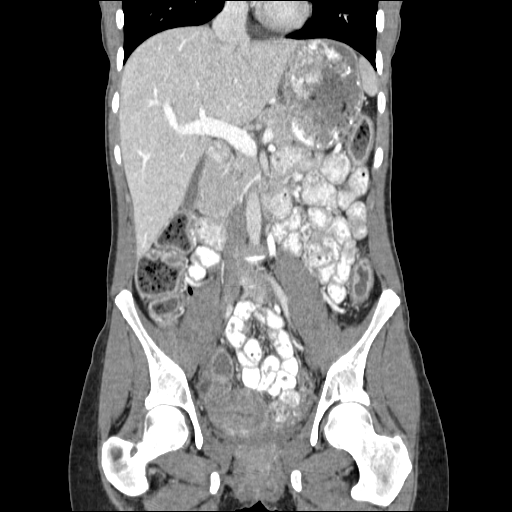
[im 51/91  soft-tissue]
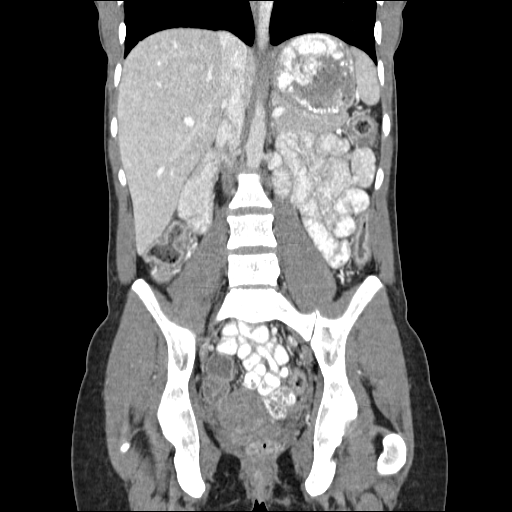

[17 of 46 positions shown; findings below may reference images not displayed]

FINDINGS: The lung bases are clear.  The heart size is normal.  No
significant pleural or pericardial effusion is present.

The liver and spleen are within normal limits.  The stomach is
mildly distended but otherwise unremarkable.  The duodenum and
pancreas are within normal limits.  The common bile duct and
gallbladder are normal.  The adrenal glands are unremarkable.  The
kidneys are normal bilaterally.

The sigmoid colon is partially collapsed that there is some
exaggeration of the mucosal enhancement and probable wall
thickening.  Similar findings are present in the proximal
descending colon.  The transverse colon is unremarkable.  The
ascending colon and appendix are within normal limits.  The distal
small bowel is featureless and slightly distended.  No extraluminal
gas or fluid is evident.  Contrast in the more proximal small bowel
appears be within normal limits.

The uterus and adnexa are normal for age.

The bone windows are unremarkable.
IMPRESSION: 1.  Mild wall thickening and mucosal enhancement in the sigmoid
colon is compatible with inflammatory bowel disease.  There could
be some inflammatory changes in the proximal descending colon as
well.
2.  Mild distention of the terminal ileum with a featureless
appearance.  This is also compatible with Crohn's disease.
3.  No complicating features of abscess or perforation.

## 2014-06-03 ENCOUNTER — Encounter: Payer: Self-pay | Admitting: Internal Medicine

## 2015-01-21 ENCOUNTER — Encounter: Payer: Self-pay | Admitting: Family Medicine

## 2015-01-21 ENCOUNTER — Ambulatory Visit (INDEPENDENT_AMBULATORY_CARE_PROVIDER_SITE_OTHER): Payer: 59 | Admitting: Family Medicine

## 2015-01-21 VITALS — BP 116/62 | HR 70 | Temp 98.2°F | Ht 65.0 in | Wt 117.0 lb

## 2015-01-21 DIAGNOSIS — F411 Generalized anxiety disorder: Secondary | ICD-10-CM

## 2015-01-21 DIAGNOSIS — Z7689 Persons encountering health services in other specified circumstances: Secondary | ICD-10-CM

## 2015-01-21 DIAGNOSIS — K50919 Crohn's disease, unspecified, with unspecified complications: Secondary | ICD-10-CM | POA: Diagnosis not present

## 2015-01-21 DIAGNOSIS — Z7189 Other specified counseling: Secondary | ICD-10-CM

## 2015-01-21 LAB — TSH: TSH: 1.231 u[IU]/mL (ref 0.350–4.500)

## 2015-01-21 NOTE — Patient Instructions (Addendum)
It was nice to meet you today!  I put in the referral to Dr. Alisia Ferrari See handout below with counselors We'll get your records I will call you if your test results are not normal.  Otherwise, I will send you a letter.  If you do not hear from me with in 2 weeks please call our office.     Follow up in ~2 months to see how you're doing, sooner if any new issues.  Be well, Dr. Ardelia Mems     [email or web address]  [facility]   [phone]  Provides information on mental health, intellectual/developmental disabilities & substance abuse services in [facility].   COUNSELING AGENCIES (Accepts Medicaid)  [place] [address]        [phone] *[facility] [address]      [phone]  [facility] [place] (I) [facility] [address]"   [phone] (I) [place] [address]  [phone] [facility] [place] (I) *[facility] [id number] [phone]   [place] [address] [phone] [facility]         [phone] (I) *[facility] [address]                 [phone] (I) [place]              [phone] (I) *[facility] [place] [address]    [phone] (I) [facility] [address] [facility] Dw[address] Rainier                    [phone] (I)* *[facility] [address]   [phone]  (I) [employer]  * Psychiatric services/servicios psiquiatricos  COUNSELING- CRISIS - 24 hour availability [place]:     [phone] [address], [place], [place] [address]   Family Service of the [facility] [phone] (Domestic Violence, Rape & Victim  Assistance )  [place]   [phone] or [phone] [facility] and Crisis Services)  [place]                          Environmental health practitioner Crisis Unit (24/7)             [phone]   [place] National Suicide Hotline    [phone] Diamantina Monks)  [place]   (Only from 8am-4pm)   [phone]

## 2015-01-22 ENCOUNTER — Encounter: Payer: Self-pay | Admitting: Family Medicine

## 2015-01-24 ENCOUNTER — Encounter: Payer: Self-pay | Admitting: Family Medicine

## 2015-01-24 DIAGNOSIS — F411 Generalized anxiety disorder: Secondary | ICD-10-CM | POA: Insufficient documentation

## 2015-01-24 DIAGNOSIS — K5090 Crohn's disease, unspecified, without complications: Secondary | ICD-10-CM | POA: Insufficient documentation

## 2015-01-24 NOTE — Assessment & Plan Note (Signed)
Administrative referral entered. Pt already well established with gi at [facility].

## 2015-01-24 NOTE — Assessment & Plan Note (Signed)
Lots of stressors. No SI/HI. -list of counselors given -check TSH -f/u in 2 mos to see how she's doing.

## 2015-01-24 NOTE — Progress Notes (Signed)
Patient ID: Cynthia [name], female   DOB: 03-22-1981, 34 y.o.   MRN: [id number]  HPI:  Patient presents today for a new patient appointment to establish general primary care, also to discuss:  - referral to GI doc - is established with Dr. Alisia Ferrari at [facility] for her chron's disease. Needs administrative referral. Has been seeing Dr. Alisia Ferrari since around age 50. Takes Stelara for this, an injectible drug for the last 2 yrs.  - stress/anxiety - came up during our discussion. Denies SI/HI. Having lots of stress with work and home life. Does not think she wants medication. Is willing to see a therapist. No hx of mental health issues in past (no hospitalizations). Family hx significant for anx/depression in several family members.  Prior PCP - Dr. Petra Kuba at [facility]  ROS: See HPI  Past Medical Hx:  -chron's disease -asthma which does not require any albuterol use -has never been pregnant (G0)  Past Surgical Hx:  -none  Family Hx: updated in Epic  Social Hx: works as Community education officer in inpatient rehab. Has stepchildren. Married. Denies tobacco use. Regularly exercises. Occasional alcohol. No drugs.   Health Maintenance:  -last pap was 1 year ago, normal  PHYSICAL EXAM: BP 116/62 mmHg  Pulse 70  Temp(Src) 98.2 F (36.8 C) (Oral)  Ht 5\' 5"  (1.651 m)  Wt 117 lb (53.071 kg)  BMI 19.47 kg/m2  LMP 01/05/2015 Gen: NAD, pleasant, cooperative HEENT: NCAT Heart: RRR  No murmur Lungs: CTAB NWOB Abd: soft NTTP Neuro: grossly nonfocal speech normal Psych: normal range of affect, well groomed, speech normal in rate and volume, normal eye contact   ASSESSMENT/PLAN:  # Transfer of care: - records request completed for prior PCP and OB/GYN  Crohn's disease Administrative referral entered. Pt already well established with gi at [facility].   Anxiety state Lots of stressors. No SI/HI. -list of counselors given -check TSH -f/u in 2 mos to see how she's doing.        FOLLOW UP: F/u in 2 mos for anxiety  [place] J. Ardelia Mems, [place]

## 2015-10-12 DIAGNOSIS — K50119 Crohn's disease of large intestine with unspecified complications: Secondary | ICD-10-CM | POA: Diagnosis not present

## 2015-10-12 MED FILL — METHOTREXATE 2.5 MG TABLET: 2.5 | 84 days supply | Qty: 72 | Fill #0

## 2015-10-12 MED FILL — FOLIC ACID 1 MG TABLET: 1 | 90 days supply | Qty: 90 | Fill #0

## 2015-10-12 MED FILL — BUDESONIDE EC 3 MG CAPSULE: 3 | 90 days supply | Qty: 180 | Fill #0

## 2015-10-12 MED FILL — ONDANSETRON HCL 4 MG TABLET: 4 | 90 days supply | Qty: 90 | Fill #0

## 2015-10-14 DIAGNOSIS — F419 Anxiety disorder, unspecified: Secondary | ICD-10-CM | POA: Diagnosis not present

## 2015-10-21 DIAGNOSIS — F419 Anxiety disorder, unspecified: Secondary | ICD-10-CM | POA: Diagnosis not present

## 2015-10-22 DIAGNOSIS — D500 Iron deficiency anemia secondary to blood loss (chronic): Secondary | ICD-10-CM | POA: Diagnosis not present

## 2015-10-22 DIAGNOSIS — K50119 Crohn's disease of large intestine with unspecified complications: Secondary | ICD-10-CM | POA: Diagnosis not present

## 2015-10-26 DIAGNOSIS — D500 Iron deficiency anemia secondary to blood loss (chronic): Secondary | ICD-10-CM | POA: Diagnosis not present

## 2015-10-26 DIAGNOSIS — K50119 Crohn's disease of large intestine with unspecified complications: Secondary | ICD-10-CM | POA: Diagnosis not present

## 2015-11-04 DIAGNOSIS — F419 Anxiety disorder, unspecified: Secondary | ICD-10-CM | POA: Diagnosis not present

## 2015-11-11 DIAGNOSIS — F419 Anxiety disorder, unspecified: Secondary | ICD-10-CM | POA: Diagnosis not present

## 2015-11-25 DIAGNOSIS — F419 Anxiety disorder, unspecified: Secondary | ICD-10-CM | POA: Diagnosis not present

## 2015-11-30 MED FILL — STELARA 90 MG/ML SOSY: 90 | 42 days supply | Qty: 1 | Fill #5

## 2015-12-09 DIAGNOSIS — F419 Anxiety disorder, unspecified: Secondary | ICD-10-CM | POA: Diagnosis not present

## 2015-12-09 DIAGNOSIS — K50918 Crohn's disease, unspecified, with other complication: Secondary | ICD-10-CM | POA: Diagnosis not present

## 2015-12-13 ENCOUNTER — Encounter: Payer: Self-pay | Admitting: Student

## 2015-12-13 ENCOUNTER — Ambulatory Visit (INDEPENDENT_AMBULATORY_CARE_PROVIDER_SITE_OTHER): Payer: 59 | Admitting: Student

## 2015-12-13 VITALS — BP 125/62 | HR 58 | Ht 65.0 in | Wt 114.0 lb

## 2015-12-13 DIAGNOSIS — K5010 Crohn's disease of large intestine without complications: Secondary | ICD-10-CM

## 2015-12-13 MED ORDER — EPINEPHRINE 0.3 MG/0.3ML IJ SOAJ: 0.3000 mg | Freq: Once | INTRAMUSCULAR | Status: AC

## 2015-12-13 MED FILL — EPINEPHRINE 0.3 MG AUTO-INJ: 0.3 | 30 days supply | Qty: 2 | Fill #0

## 2015-12-13 MED FILL — CIPROFLOXACIN HCL 500 MG TA: 500 | 3 days supply | Qty: 6 | Fill #0

## 2015-12-13 MED FILL — ATOVAQUONE-PROGUANIL 250-10: 250-100 | 19 days supply | Qty: 19 | Fill #0

## 2015-12-13 NOTE — Assessment & Plan Note (Addendum)
GI referral made to Dr Alisia Ferrari to keep getting Crohns Ustekinumab - Pt will follow with questions or concerns

## 2015-12-13 NOTE — Patient Instructions (Signed)
Follow-up as needed  if you have questions or concerns call the office at [phone]

## 2015-12-13 NOTE — Progress Notes (Signed)
   Subjective:    Patient ID: Cynthia [name], female    DOB: 1981/06/09, 35 y.o.   MRN: [id number]   CC: GI referral  HPI: 35 y/o female with history of Crohns presents for GI referral to obtain Crohns medication  Crohns -She takes Ustekinumab and needs yearly GI referral to keep getting this medication.  -She reports good control of the Crohns with this medication  Review of Systems ROS Per the HPI else  She denies recent illnesses, fevers, nausea, vomiting, diarrhea, abdominal pain, headache, changes in vision   Past Medical, Surgical, Social, and Family History Reviewed & Updated per EMR.   Objective:  BP 125/62 mmHg  Pulse 58  Ht 5\' 5"  (1.651 m)  Wt 114 lb (51.71 kg)  BMI 18.97 kg/m2  LMP 12/08/2015 (Approximate) Vitals and nursing note reviewed  General: NAD Cardiac: RRR Respiratory: CTAB, normal effort Abdomen: soft, nontender, nondistended, bowel sounds present Neuro: alert and oriented, no focal deficits   Assessment & Plan:    Crohn's disease GI referral made to Dr Alisia Ferrari to keep getting Crohns Ustekinumab - Pt will follow with questions or concerns     Rovena Hearld A. Lincoln Brigham MD, [place] [facility] Resident PGY-2 Pager [phone]

## 2016-01-03 MED FILL — FOLIC ACID 1 MG TABLET: 1 | 90 days supply | Qty: 90 | Fill #1

## 2016-01-03 MED FILL — METHOTREXATE 2.5 MG TABLET: 2.5 | 84 days supply | Qty: 72 | Fill #1

## 2016-01-11 MED FILL — STELARA 90 MG/ML SOSY: 90 | 42 days supply | Qty: 1 | Fill #6

## 2016-01-13 DIAGNOSIS — F419 Anxiety disorder, unspecified: Secondary | ICD-10-CM | POA: Diagnosis not present

## 2016-02-10 DIAGNOSIS — F419 Anxiety disorder, unspecified: Secondary | ICD-10-CM | POA: Diagnosis not present

## 2016-02-15 DIAGNOSIS — K5010 Crohn's disease of large intestine without complications: Secondary | ICD-10-CM | POA: Diagnosis not present

## 2016-02-15 MED FILL — BUDESONIDE EC 3 MG CAPSULE: 3 | 90 days supply | Qty: 270 | Fill #0

## 2016-02-15 MED FILL — DOXYCYCLINE HYCLATE 100 MG: 100 | 30 days supply | Qty: 60 | Fill #0

## 2016-02-24 DIAGNOSIS — F419 Anxiety disorder, unspecified: Secondary | ICD-10-CM | POA: Diagnosis not present

## 2016-02-25 MED FILL — STELARA 90 MG/ML SOSY: 90 | 42 days supply | Qty: 1 | Fill #0

## 2016-03-09 DIAGNOSIS — F419 Anxiety disorder, unspecified: Secondary | ICD-10-CM | POA: Diagnosis not present

## 2016-03-09 DIAGNOSIS — K50018 Crohn's disease of small intestine with other complication: Secondary | ICD-10-CM | POA: Diagnosis not present

## 2016-03-10 DIAGNOSIS — H04123 Dry eye syndrome of bilateral lacrimal glands: Secondary | ICD-10-CM | POA: Diagnosis not present

## 2016-03-13 ENCOUNTER — Telehealth: Payer: Self-pay | Admitting: *Deleted

## 2016-03-13 NOTE — Telephone Encounter (Signed)
Patient called to confirm that Dr. Lincoln Brigham received fax regarding suggestions from her counseling center.  Fax is in provider's box for review and patient would like a call regarding next steps/ decision made.  Jalani Rominger,CMA

## 2016-03-14 MED ORDER — SERTRALINE HCL 50 MG PO TABS: 50.0000 mg | ORAL_TABLET | Freq: Every day | ORAL | Status: DC

## 2016-03-14 MED FILL — SERTRALINE HCL 50 MG TABLET: 50 | 30 days supply | Qty: 30 | Fill #0

## 2016-03-14 NOTE — Telephone Encounter (Signed)
Spoke to the pateint regarding her therapist's recomendations and her anxiety symptoms. Reports he has had on going anxiety and depression regarding her crohns disease. She now feels her mood is affecting her work. She has been seeing a therapist who reccommended an SSRI. This medication was cleared by her GI physician in terms of risk for interaction with her crohns therapy. She denies current SI/HI or access to guns or weapons but does state she has had SI in past. She denies ever having an active plan. Will start on Zoloft 50 qD, and follow up ASAP to discuss her symptoms. Will follow 2 weeks post initiation of therapy to ensure tolerance   Mattis Featherly A. Lincoln Brigham MD, [place] [facility] Resident PGY-2 Pager [phone]

## 2016-03-14 NOTE — Telephone Encounter (Signed)
Patient calling again, would like MD to give her a call.

## 2016-03-15 ENCOUNTER — Ambulatory Visit (HOSPITAL_BASED_OUTPATIENT_CLINIC_OR_DEPARTMENT_OTHER): Payer: 59 | Admitting: Pharmacist

## 2016-03-15 ENCOUNTER — Encounter: Payer: Self-pay | Admitting: Student

## 2016-03-15 ENCOUNTER — Ambulatory Visit (INDEPENDENT_AMBULATORY_CARE_PROVIDER_SITE_OTHER): Payer: 59 | Admitting: Student

## 2016-03-15 VITALS — BP 105/62 | HR 67 | Temp 98.3°F | Wt 118.0 lb

## 2016-03-15 DIAGNOSIS — K5090 Crohn's disease, unspecified, without complications: Secondary | ICD-10-CM

## 2016-03-15 DIAGNOSIS — F411 Generalized anxiety disorder: Secondary | ICD-10-CM | POA: Diagnosis not present

## 2016-03-15 MED ORDER — USTEKINUMAB 90 MG/ML ~~LOC~~ SOSY: 90.0000 mg | PREFILLED_SYRINGE | SUBCUTANEOUS | Status: DC

## 2016-03-15 NOTE — Progress Notes (Signed)
S: Patient presents today to the [facility].  Patient is currently taking Stelara for Crohn's disease. Patient is managed by Dr. Alisia Ferrari for this.   Adherence: denies any missed doses.  Dosing: 90 mg subq every 6 weeks.  Drug-drug interactions: none - doxycycline can increase methotrexate concentrations so patient will be monitored for toxicities.  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  Squamous cell skin carcinoma: denies  Of note, LFTs were elevated - ALT 41, AST 78  Patient feels like the Stelara is working well for her overall but it did not do well as monotherapy so methotrexate and budesonide had to be added. She is concerned about finding a therapy that would work for her that does not require her to take methotrexate because she would like to become pregnant in the future and methotrexate is a category X drug. She has been on Humira in the past but it caused psoriasis and she did not do well on Enbrel. She also failed Cimzia. Budesonide dose was increased and doxycycline was added in May 2017 to optimize therapy and improve colitis.  O:     Lab Results  Component Value Date   WBC 9.3 07/06/2010   HGB 13.5 07/06/2010   HCT 41.4 07/06/2010   MCV 86.8 07/06/2010   PLT 292.0 07/06/2010      Chemistry      Component Value Date/Time   NA 138 07/06/2010 0905   K 4.2 07/06/2010 0905   CL 99 07/06/2010 0905   CO2 32 07/06/2010 0905   BUN 7 07/06/2010 0905   CREATININE 0.8 07/06/2010 0905      Component Value Date/Time   CALCIUM 8.7 07/06/2010 0905   ALKPHOS 60 05/09/2010 1442   AST 14 05/09/2010 1442   ALT 12 05/09/2010 1442   BILITOT 0.3 05/09/2010 1442       A/P: 1.  Medication review: Patient is tolerating Stelara well with no adverse effects from the Stelara (she reports dry eyes and headaches due to dry eyes and memory issues due to stress). It was no efficacious on its own so patient is on methotrexate and budesonide. Of note, patient would like information on options for when she becomes pregnant as methotrexate is pregnancy category X. Will research topic and provide any findings to patient. Otherwise, reviewed the medication with the patient, including the following: Stelara, ustekinumab, is a TNF blocker.  There is an increased risk of infection and malignancy with this medication. Do not give patients live vaccinations while they are on this medication. No recommendations for changes.    Nicoletta Ba, PharmD, BCPS, [place] [facility] [phone]  Evaluation and management procedures were performed by the Clinical Pharmacy Practitioner under my supervision and collaboration. I have reviewed the Practitioner's note and chart, and I agree with the management and plan as documented above.   Angelica Chessman, MD, [place], [place], [place], [place] and [place], [place]   03/15/2016, 2:51 PM
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
## 2016-03-15 NOTE — Patient Instructions (Signed)
Follow up in 2 weeks Please take the Zoloft daily If you have any concerning symptoms, call the office to discuss If you have any questions or concerns, call the office at [phone]

## 2016-03-16 NOTE — Assessment & Plan Note (Addendum)
Understandable anxiety in setting of the significant stressors of her divorce and disease. GAD7 15, PHQ9- 10.  - will start zoloft 50 qD - short acting medications, like benzodiazepine for the short tern until Zoloft takes effect offered, but she declined - return in in 2 weeks to assess tolerance - discussed earlier return precautions as well a counseled to increase frequency with counselor should she feel she needs it - will follow with behavioral health at this clinic for further support as needed -

## 2016-03-16 NOTE — Assessment & Plan Note (Signed)
Stable. Will follow along with GI

## 2016-03-27 ENCOUNTER — Ambulatory Visit (INDEPENDENT_AMBULATORY_CARE_PROVIDER_SITE_OTHER): Payer: 59 | Admitting: Student

## 2016-03-27 ENCOUNTER — Encounter: Payer: Self-pay | Admitting: Student

## 2016-03-27 VITALS — BP 110/60 | HR 65 | Temp 98.1°F | Ht 65.0 in | Wt 118.0 lb

## 2016-03-27 DIAGNOSIS — F411 Generalized anxiety disorder: Secondary | ICD-10-CM

## 2016-03-27 NOTE — Patient Instructions (Signed)
Follow up in 3 weeks for Anti depression med Take Zoloft 50 mg daily If you have any questions, concerns or side effects, call the office at [phone]

## 2016-03-28 MED FILL — METHOTREXATE 2.5 MG TABLET: 2.5 | 84 days supply | Qty: 72 | Fill #2

## 2016-03-30 DIAGNOSIS — Z7689 Persons encountering health services in other specified circumstances: Secondary | ICD-10-CM | POA: Diagnosis not present

## 2016-03-30 DIAGNOSIS — F419 Anxiety disorder, unspecified: Secondary | ICD-10-CM | POA: Diagnosis not present

## 2016-03-30 NOTE — Progress Notes (Signed)
   Subjective:    Patient ID: Cynthia [name], female    DOB: 18-Sep-1981, 35 y.o.   MRN: [id number]   CC: Zoloft follow up  HPI: 35 y/o F with anxiety for Zoloft and anxiety   Anxiety - feels  Anxiety and mood have improved PHQ 9- 5 today - however after takiong zoloft 50 once that night she felt "jittery"  - since then she has been taking 25 and has been tolerating it well - she would like to increase to 50 today - she has not skipped any days - she continues to see her own therapist and feels that is helpful - denies SI/HI - while her divorce is still stressful, she is switching jobs to a less stress inducing role and she thinks is helping her to feel better   Smoking status reviewed  Review of Systems Per HPI else denies recent illness, SOB, chest pain  Objective:  BP 110/60 mmHg  Pulse 65  Temp(Src) 98.1 F (36.7 C) (Oral)  Ht 5\' 5"  (1.651 m)  Wt 118 lb (53.524 kg)  BMI 19.64 kg/m2  LMP 03/15/2016 Vitals and nursing note reviewed  General: NAD Cardiac: RRR,  Respiratory: CTAB, normal effort Extremities: no edema or cyanosis. WWP. Skin: warm and dry, no rashes noted Neuro: alert and oriented Psych- normal mood and affect   Assessment & Plan:    Anxiety state Will increase Zoloft to 50 mg today. [facility] Behavioral health offered but she feels that her current counselor is adequate - follow up in 3 weeks for anxiety and to assess zoloft tolerance     Van Seymore A. Lincoln Brigham MD, [place] [facility] Resident PGY-2 Pager [phone]

## 2016-03-30 NOTE — Assessment & Plan Note (Signed)
Will increase Zoloft to 50 mg today. [facility] Behavioral health offered but she feels that her current counselor is adequate - follow up in 3 weeks for anxiety and to assess zoloft tolerance

## 2016-04-05 MED FILL — DOXYCYCLINE HYCLATE 100 MG: 100 | 30 days supply | Qty: 60 | Fill #1

## 2016-04-05 MED FILL — FOLIC ACID 1 MG TABLET: 1 | 90 days supply | Qty: 90 | Fill #2

## 2016-04-21 MED FILL — SERTRALINE HCL 50 MG TABLET: 50 | 30 days supply | Qty: 30 | Fill #1

## 2016-04-21 MED FILL — STELARA 90 MG/ML SOSY: 90 | 42 days supply | Qty: 1 | Fill #0

## 2016-04-25 ENCOUNTER — Encounter: Payer: Self-pay | Admitting: Student

## 2016-04-25 ENCOUNTER — Ambulatory Visit (INDEPENDENT_AMBULATORY_CARE_PROVIDER_SITE_OTHER): Payer: 59 | Admitting: Student

## 2016-04-25 DIAGNOSIS — F411 Generalized anxiety disorder: Secondary | ICD-10-CM

## 2016-04-25 MED ORDER — SERTRALINE HCL 50 MG PO TABS: 75.0000 mg | ORAL_TABLET | Freq: Every day | ORAL | 1 refills | Status: DC

## 2016-04-25 NOTE — Assessment & Plan Note (Signed)
Anxiety improving with Zoloft 50 with good tolerance. Will increase to 75 mg to help improve symptoms further - Follow-up in 2 weeks to assess for compliance and side effects - Will continue to titrate as needed to control symptoms

## 2016-04-25 NOTE — Progress Notes (Signed)
   Subjective:    Patient ID: Cynthia [name], female    DOB: 1980/11/17, 35 y.o.   MRN: [id number]   CC: Anxiety follow up  HPI: 35 year old female with history of anxiety presents for follow-up  Anxiety - Zoloft increased to 50 mg. - Patient reports compliance with this regimen and states it is well tolerated - She feels her anxiety has improved. Has had only one panic attack over the last 5 weeks since she has started Zoloft - Prior to starting Zoloft she was having panic attacks weekly - She is changing her job and she feels some job stress associated with this but overall feels anxiety is better - Still posttherapy every other week - Reports her mood is good denies SI/HI - She feels her anxiety is improving she is interested in increasing her her Zoloft to a higher dose  Smoking status reviewed  Review of Systems  Per HPI, else denies recent illness, fever, headache, changes in vision, chest pain, shortness of breath, abdominal pain, N/V/D, weakness    Objective:  BP (!) 107/57 (BP Location: Left Arm, Patient Position: Sitting, Cuff Size: Normal)   Pulse (!) 58   Temp 98.4 F (36.9 C) (Oral)   Ht 5\' 5"  (1.651 m)   Wt 116 lb 12.8 oz (53 kg)   LMP 04/09/2016   BMI 19.44 kg/m  Vitals and nursing note reviewed  General: NAD Cardiac: RRR Respiratory: CTAB, normal effort Abdomen: soft, nontender, nondistended, bowel sounds present. Skin: warm and dry, no rashes noted Neuro: alert and oriented   Assessment & Plan:    Anxiety state Anxiety improving with Zoloft 50 with good tolerance. Will increase to 75 mg to help improve symptoms further - Follow-up in 2 weeks to assess for compliance and side effects - Will continue to titrate as needed to control symptoms    Alyssa A. Lincoln Brigham MD, [place] [facility] Resident PGY-3 Pager [phone]

## 2016-04-25 NOTE — Patient Instructions (Signed)
Follow up in 2 weeks Take Zoloft 75 mg daily If you have any side effects or concerns, call the office If you have questions or concerns, call the office at [phone]

## 2016-05-04 DIAGNOSIS — F419 Anxiety disorder, unspecified: Secondary | ICD-10-CM | POA: Diagnosis not present

## 2016-05-15 MED FILL — SERTRALINE HCL 50 MG TABLET: 50 | 30 days supply | Qty: 45 | Fill #0

## 2016-05-17 ENCOUNTER — Encounter: Payer: Self-pay | Admitting: Student

## 2016-05-17 ENCOUNTER — Ambulatory Visit (INDEPENDENT_AMBULATORY_CARE_PROVIDER_SITE_OTHER): Payer: 59 | Admitting: Student

## 2016-05-17 DIAGNOSIS — F411 Generalized anxiety disorder: Secondary | ICD-10-CM | POA: Diagnosis not present

## 2016-05-17 NOTE — Patient Instructions (Signed)
Follow-up in one month for depression You can go back to Zoloft 50 mg per day If you feel you're mood is not well controlled with this regimen, please call the office to discuss adjusting her medications If questions or concerns call the office at [phone]

## 2016-05-17 NOTE — Progress Notes (Signed)
   Subjective:    Patient ID: Cynthia [name], female    DOB: 10-21-1980, 35 y.o.   MRN: [id number]   CC: f/u depression  HPI: 35 year old female presenting for follow-up anxiety  anxiety - Zoloft increased to 75 mg daily 2 weeks ago - Feels that since then she has had increasing vivid dreams, but are not distressing she does not like them - Feels her depression has greatly improved with pH Q9 2 today - denies SI/HI  Smoking status reviewed  Review of Systems  Per HPI, else denies recent illness, fever, headache, changes in vision, chest pain, shortness of breath, abdominal pain, N/V/D, weakness    Objective:  BP 110/62   Pulse 69   Temp 98.4 F (36.9 C) (Oral)   Wt 115 lb (52.2 kg)   LMP 05/10/2016   SpO2 99%   BMI 19.14 kg/m  Vitals and nursing note reviewed  General: NAD Cardiac: RRR,  Respiratory: CTAB, normal effort Extremities: no edema or cyanosis.  Skin: warm and dry, no rashes noted Neuro: alert and oriented Psych: Normal mood and affect   Assessment & Plan:    Anxiety state Feels her anxiety largely improved however feels that her vivid dreams are worsening with increasing Zoloft - the Zoloft to 50 mg daily - Follow-up in one month or earlier if her anxiety worsens - Continue to follow closely    Zamaya Rapaport A. Lincoln Brigham MD, [facility] Resident PGY-3 Pager [phone]

## 2016-05-18 NOTE — Assessment & Plan Note (Signed)
Feels her anxiety largely improved however feels that her vivid dreams are worsening with increasing Zoloft - the Zoloft to 50 mg daily - Follow-up in one month or earlier if her anxiety worsens - Continue to follow closely

## 2016-06-01 DIAGNOSIS — F419 Anxiety disorder, unspecified: Secondary | ICD-10-CM | POA: Diagnosis not present

## 2016-06-06 MED FILL — STELARA 90 MG/ML SOSY: 90 | 42 days supply | Qty: 1 | Fill #1

## 2016-06-08 DIAGNOSIS — K50919 Crohn's disease, unspecified, with unspecified complications: Secondary | ICD-10-CM | POA: Diagnosis not present

## 2016-06-13 DIAGNOSIS — J029 Acute pharyngitis, unspecified: Secondary | ICD-10-CM | POA: Diagnosis not present

## 2016-06-13 DIAGNOSIS — F411 Generalized anxiety disorder: Secondary | ICD-10-CM | POA: Diagnosis not present

## 2016-06-13 DIAGNOSIS — Z01419 Encounter for gynecological examination (general) (routine) without abnormal findings: Secondary | ICD-10-CM | POA: Diagnosis not present

## 2016-06-16 ENCOUNTER — Ambulatory Visit (INDEPENDENT_AMBULATORY_CARE_PROVIDER_SITE_OTHER): Payer: 59 | Admitting: Student

## 2016-06-16 ENCOUNTER — Encounter: Payer: Self-pay | Admitting: Student

## 2016-06-16 DIAGNOSIS — F411 Generalized anxiety disorder: Secondary | ICD-10-CM

## 2016-06-16 NOTE — Patient Instructions (Signed)
Follow up as needed If you have questions or concerns, call the office at [phone]

## 2016-06-16 NOTE — Assessment & Plan Note (Signed)
Feels her anxiety is much improved with Zoloft. -Will continue to follow - Patient continues to desire to follow with her therapist for [place] [facility]

## 2016-06-16 NOTE — Progress Notes (Signed)
   Subjective:    Patient ID: Cynthia [name], female    DOB: 1980/11/16, 35 y.o.   MRN: [id number]   CC: anxiety follow up  HPI: 35 year old female with history of anxiety/depression presents for follow-up   Anxiety/Depression - Zoloft increased to 50 mg 2 weeks ago - She feels less jittery with this dose and has had reduced visit dreams - PHQ 9 is 3 today - Feels her mood is much improved as well as her anxiety - Now works a stressful job, and feels less stressed about her divorce - Denies SI/HI  Smoking status reviewed  Review of Systems  Per HPI, else denies recent illness, fever, headache, changes in vision, chest pain, shortness of breath, abdominal pain, N/V/D, weakness    Objective:  BP 111/60   Pulse (!) 54   Temp 98.2 F (36.8 C) (Oral)   Wt 116 lb (52.6 kg)   LMP 06/01/2016   SpO2 99%   BMI 19.30 kg/m  Vitals and nursing note reviewed  General: NAD Cardiac: RRR Respiratory: CTAB, normal effort Skin: warm and dry, no rashes noted    Assessment & Plan:    Anxiety state Feels her anxiety is much improved with Zoloft. -Will continue to follow - Patient continues to desire to follow with her therapist for [place]. Lincoln Brigham MD, [facility] Resident PGY-3 Pager [phone]

## 2016-06-19 MED FILL — METHOTREXATE 2.5 MG TABLET: 2.5 | 84 days supply | Qty: 72 | Fill #3

## 2016-06-27 MED FILL — SERTRALINE HCL 50 MG TABLET: 50 | 30 days supply | Qty: 45 | Fill #1

## 2016-07-05 MED FILL — FOLIC ACID 1 MG TABLET: 1 | 90 days supply | Qty: 90 | Fill #3

## 2016-07-05 MED FILL — BUDESONIDE EC 3 MG CAPSULE: 3 | 90 days supply | Qty: 270 | Fill #1

## 2016-07-24 MED FILL — STELARA 90 MG/ML SOSY: 90 | 42 days supply | Qty: 1 | Fill #2

## 2016-07-27 DIAGNOSIS — J029 Acute pharyngitis, unspecified: Secondary | ICD-10-CM | POA: Diagnosis not present

## 2016-07-27 DIAGNOSIS — F411 Generalized anxiety disorder: Secondary | ICD-10-CM | POA: Diagnosis not present

## 2016-08-07 ENCOUNTER — Other Ambulatory Visit: Payer: Self-pay | Admitting: Student

## 2016-08-07 MED FILL — SERTRALINE HCL 50 MG TABLET: 50 | 30 days supply | Qty: 45 | Fill #0

## 2016-08-08 ENCOUNTER — Ambulatory Visit
Admission: EM | Admit: 2016-08-08 | Discharge: 2016-08-08 | Disposition: A | Discharge status: Home / Self Care | Payer: 59 | Attending: Family Medicine | Admitting: Family Medicine

## 2016-08-08 DIAGNOSIS — J029 Acute pharyngitis, unspecified: Secondary | ICD-10-CM | POA: Diagnosis not present

## 2016-08-08 DIAGNOSIS — K5010 Crohn's disease of large intestine without complications: Secondary | ICD-10-CM | POA: Diagnosis not present

## 2016-08-08 LAB — CBC WITH DIFFERENTIAL/PLATELET
BASOS PCT: 1 %
Basophils Absolute: 0.1 10*3/uL (ref 0–0.1)
Eosinophils Absolute: 0.2 10*3/uL (ref 0–0.7)
Eosinophils Relative: 3 %
HEMATOCRIT: 45 % (ref 35.0–47.0)
HEMOGLOBIN: 14.6 g/dL (ref 12.0–16.0)
LYMPHS PCT: 29 %
Lymphs Abs: 1.8 10*3/uL (ref 1.0–3.6)
MCH: 28.9 pg (ref 26.0–34.0)
MCHC: 32.4 g/dL (ref 32.0–36.0)
MCV: 89.3 fL (ref 80.0–100.0)
MONO ABS: 0.4 10*3/uL (ref 0.2–0.9)
MONOS PCT: 6 %
NEUTROS ABS: 3.7 10*3/uL (ref 1.4–6.5)
NEUTROS PCT: 61 %
Platelets: 215 10*3/uL (ref 150–440)
RBC: 5.04 MIL/uL (ref 3.80–5.20)
RDW: 13.4 % (ref 11.5–14.5)
WBC: 6.1 10*3/uL (ref 3.6–11.0)

## 2016-08-08 LAB — MONONUCLEOSIS SCREEN: MONO SCREEN: NEGATIVE

## 2016-08-08 LAB — RAPID STREP SCREEN (MED CTR MEBANE ONLY): Streptococcus, Group A Screen (Direct): NEGATIVE

## 2016-08-08 NOTE — Discharge Instructions (Signed)
Rest. Drink plenty of fluids.  ° °Follow up with your primary care physician this week as needed. Return to Urgent care for new or worsening concerns.  ° °

## 2016-08-08 NOTE — ED Triage Notes (Addendum)
Pt c/o sore throat and body aches, headache since yesterday.

## 2016-08-08 NOTE — ED Provider Notes (Signed)
[facility] ____________________________________________  Time seen: Approximately 2:58 PM  I have reviewed the triage vital signs and the nursing notes.   HISTORY  Chief Complaint Sore Throat   HPI Cynthia [name] is a 35 y.o. female presents with a complaint of sore throat. Patient reports that she has had some sore throat, body ache and headache since yesterday. Denies fevers. Patient reports she has also had some runny nose that she contributed to allergies in the last several weeks. States some cough also since yesterday. Reports continues to eat and drink well. Denies known sick contacts. Denies chest pain, shortness of breath, abdominal pain, dysuria, neck pain, back pain, recent sickness, or recent antibiotic use.  Marina Goodell, MD: PCP Patient's last menstrual period was 08/04/2016.: Denies pregnancy.   Past Medical History:  Diagnosis Date  . Crohn disease [facility])     Patient Active Problem List   Diagnosis Date Noted  . Anxiety state 01/24/2015  . Crohn's disease ([place]) 01/24/2015  . [place] INTESTINE 07/06/2010  . WEIGHT LOSS-ABNORMAL 07/06/2010  . DIARRHEA 07/06/2010  . NONSPEC ELEVATION OF LEVELS OF TRANSAMINASE/LDH 01/13/2009  . EXERCISE INDUCED ASTHMA 03/18/2008  . CROHN'S DISEASE 03/18/2008  . ANAL FISTULA 03/18/2008  . PSORIASIS 03/18/2008  . OSTEOPENIA 03/18/2008  . ANEMIA, HX OF 03/18/2008    History reviewed. No pertinent surgical history.  Current Outpatient Rx  . Order #: [id number] Class: Historical Med  . Order #: [id number] Class: Historical Med  . Order #: [id number] Class: Historical Med  . Order #: [id number] Class: Historical Med  . Order #: [id number] Class: Historical Med  . Order #: [id number] Class: Normal  . Order #: [id number] Class: Normal  . Order #: [id number] Class: Normal    No current facility-administered medications for this encounter.   Current Outpatient Prescriptions:  .  budesonide (ENTOCORT EC) 3 MG 24 hr  capsule, Take 6 mg by mouth daily., Disp: , Rfl:  .  cholecalciferol (VITAMIN D) 1000 units tablet, Take 1,000 Units by mouth daily., Disp: , Rfl:  .  doxycycline (VIBRAMYCIN) 100 MG capsule, Take 100 mg by mouth 2 (two) times daily., Disp: , Rfl:  .  methotrexate (RHEUMATREX) 2.5 MG tablet, Take 15 mg by mouth once a week. Caution:Chemotherapy. Protect from light., Disp: , Rfl:  .  Multiple Vitamins-Minerals (MULTIVITAMIN WITH MINERALS) tablet, Take 1 tablet by mouth daily., Disp: , Rfl:  .  sertraline (ZOLOFT) 50 MG tablet, TAKE 1 AND 1/2 TABLETS BY MOUTH ONCE DAILY, Disp: 45 tablet, Rfl: 5 .  ustekinumab (STELARA) 90 MG/ML SOSY injection, Inject 1 mL (90 mg total) into the skin every 6 (six) weeks., Disp: 1 mL, Rfl: 7 .  EPINEPHrine (EPIPEN 2-PAK) 0.3 mg/0.3 mL IJ SOAJ injection, Inject 0.3 mLs (0.3 mg total) into the muscle once., Disp: 1 Device, Rfl: 3  Allergies Almond meal and Soy allergy  Family History  Problem Relation Age of Onset  . Diabetes      grandparent  . Skin cancer      grandfather  . Stroke      grandmother    Social History Social History  Substance Use Topics  . Smoking status: Never Smoker  . Smokeless tobacco: Never Used  . Alcohol use Not on file    Review of Systems Constitutional: As above. Eyes: No visual changes. ENT: Positive sore throat. Cardiovascular: Denies chest pain. Respiratory: Denies shortness of breath. Gastrointestinal: No abdominal pain.  No nausea, no vomiting.  No diarrhea.  No constipation. Genitourinary: Negative for dysuria. Musculoskeletal:  Negative for back pain. Skin: Negative for rash. Neurological: Negative for headaches, focal weakness or numbness.  10-point ROS otherwise negative.  ____________________________________________   PHYSICAL EXAM:  VITAL SIGNS: ED Triage Vitals  Enc Vitals Group     BP 08/08/16 1251 110/65     Pulse Rate 08/08/16 1251 (!) 58     Resp 08/08/16 1251 18     Temp 08/08/16 1251 97.5  F (36.4 C)     Temp Source 08/08/16 1251 Oral     SpO2 08/08/16 1251 100 %     Weight 08/08/16 1252 117 lb (53.1 kg)     Height 08/08/16 1252 5\' 5"  (1.651 m)     Head Circumference --      Peak Flow --      Pain Score 08/08/16 1247 3     Pain Loc --      Pain Edu? --      Excl. in [place]? --    Constitutional: Alert and oriented. Well appearing and in no acute distress.  Eyes: Conjunctivae are normal. PERRL. EOMI. Head: Atraumatic. No sinus tenderness to palpation. No swelling. No erythema.  Ears: no erythema, normal TMs bilaterally.   Nose: No nasal congestion or rhinorrhea.   Mouth/Throat: Mucous membranes are moist. Mild pharyngeal erythema. No tonsillar swelling or exudate.  Neck: No stridor.  No cervical spine tenderness to palpation. Hematological/Lymphatic/Immunilogical: No cervical lymphadenopathy. Cardiovascular: Normal rate, regular rhythm. Grossly normal heart sounds.  Good peripheral circulation. Respiratory: Normal respiratory effort.  No retractions. Lungs CTAB. No wheezes, rales or rhonchi. Good air movement.  Gastrointestinal: Soft and nontender. Normal Bowel sounds. No CVA tenderness. No hepatosplenomegaly palpated. Musculoskeletal: Ambulatory with steady gait. No cervical, thoracic or lumbar tenderness to palpation. Neurologic:  Normal speech and language. No gross focal neurologic deficits are appreciated. No gait instability. Skin:  Skin is warm, dry and intact. No rash noted. Psychiatric: Mood and affect are normal. Speech and behavior are normal. __________________________________________   LABS (all labs ordered are listed, but only abnormal results are displayed)  Labs Reviewed  RAPID STREP SCREEN (NOT AT [facility])  CULTURE, GROUP A STREP ([place])  CBC WITH DIFFERENTIAL/PLATELET  MONONUCLEOSIS SCREEN     PROCEDURES Procedures  __________________________________________   INITIAL IMPRESSION / ASSESSMENT AND PLAN / ED COURSE  Pertinent labs & imaging  results that were available during my care of the patient were reviewed by me and considered in my medical decision making (see chart for details).  Very well-appearing patient. No acute distress. Presenting for sore throat since yesterday. Quick strep negative, will culture. Discussed in detail with patient evaluation of mono, and patient agrees. Mono tested and found to be negative. Suspect viral pharyngitis. Encouraged supportive care. Work note for tomorrow given. Denies need for prescription medications.   Discussed follow up with Primary care physician this week. Discussed follow up and return parameters including no resolution or any worsening concerns. Patient verbalized understanding and agreed to plan.   ____________________________________________   FINAL CLINICAL IMPRESSION(S) / ED DIAGNOSES  Final diagnoses:  Pharyngitis, unspecified etiology     New Prescriptions   No medications on file    Note: This dictation was prepared with Dragon dictation along with smaller phrase technology. Any transcriptional errors that result from this process are unintentional.    Clinical Course       Marylene Land, NP 08/08/16 1504

## 2016-08-09 DIAGNOSIS — K5010 Crohn's disease of large intestine without complications: Secondary | ICD-10-CM | POA: Diagnosis not present

## 2016-08-11 ENCOUNTER — Telehealth: Payer: Self-pay | Admitting: *Deleted

## 2016-08-11 LAB — CULTURE, GROUP A STREP (THRC)

## 2016-08-11 NOTE — Telephone Encounter (Signed)
Called patient, no answer, left message reporting a negative strep culture result. Advised patient to follow up with PCP or [facility] if symptoms persist.

## 2016-08-16 ENCOUNTER — Encounter: Payer: Self-pay | Admitting: Student

## 2016-08-16 ENCOUNTER — Ambulatory Visit (INDEPENDENT_AMBULATORY_CARE_PROVIDER_SITE_OTHER): Payer: 59 | Admitting: Student

## 2016-08-16 VITALS — BP 99/67 | HR 62 | Temp 97.8°F | Wt 115.0 lb

## 2016-08-16 DIAGNOSIS — J029 Acute pharyngitis, unspecified: Secondary | ICD-10-CM | POA: Diagnosis not present

## 2016-08-16 DIAGNOSIS — J069 Acute upper respiratory infection, unspecified: Secondary | ICD-10-CM | POA: Diagnosis not present

## 2016-08-16 LAB — POCT RAPID STREP A (OFFICE): Rapid Strep A Screen: NEGATIVE

## 2016-08-16 NOTE — Progress Notes (Signed)
   Subjective:    Patient ID: Cynthia [name], female    DOB: 29-Apr-1981, 35 y.o.   MRN: [id number]   CC:  HPI:   Smoking status reviewed  Review of Systems  Per HPI, else denies recent illness, fever, headache, changes in vision, chest pain, shortness of breath, abdominal pain, N/V/D, weakness    Objective:  BP 99/67   Pulse 62   Temp 97.8 F (36.6 C) (Oral)   Wt 115 lb (52.2 kg)   LMP 08/04/2016   SpO2 100%   BMI 19.14 kg/m    Vitals and nursing note reviewed  General: NAD Cardiac: RRR, normal heart sounds, no murmurs. 2+ radial and PT pulses bilaterally Respiratory: CTAB, normal effort Abdomen: soft, nontender, nondistended, no hepatic or splenomegaly. Bowel sounds present Extremities: no edema or cyanosis. WWP. Skin: warm and dry, no rashes noted Neuro: alert and oriented, no focal deficits   Assessment & Plan:    No problem-specific Assessment & Plan notes found for this encounter.    Taleeya Blondin A. Lincoln Brigham MD, [facility] Resident PGY-3 Pager [phone]

## 2016-08-16 NOTE — Progress Notes (Signed)
   Subjective:    Patient ID: Cynthia [name], female    DOB: 1981/04/02, 35 y.o.   MRN: [id number]   CC: Sore throat  HPI: 35 y/o F with Congestion, sore throat for 1 week  Strep throat, congestion - Started approximately 1 week ago - Went to urgent care and had negative strep and mono testing - She is concerned that her sore throat has continued to get worse since then -Denies fevers, shortness of breath  -  denies vomiting, diarrhea. She has had intermittent nausea  - Has been able to eat and drink normally   Smoking status reviewed  Review of Systems  Per HPI   Objective:  BP 99/67   Pulse 62   Temp 97.8 F (36.6 C) (Oral)   Wt 115 lb (52.2 kg)   LMP 08/04/2016   SpO2 100%   BMI 19.14 kg/m  Vitals and nursing note reviewed  General: NAD HEENT: Normal tympanic membranes bilaterally, mildly erythematous oropharynx, but no exudates or lesions, no swelling, nontender maxillary and frontal sinuses, no conjunctival injection, mildly enlarged bilateral cervical lymph nodes Cardiac: RRR Respiratory: CTAB, normal effort Abdomen: soft, nontender, nondistended, + BS Skin: warm and dry, no rashes noted Neuro: alert and oriented, normal neck range of motion, able to touch chin to chest without difficulty   Assessment & Plan:    Acute upper respiratory infection 1 week of congestion, sore throat, without fevers. Benign exam with normal vital signs. Strep test was repeated given concern for worsening sore throat. Strep was negative. Physical exam and history consistent with acute viral upper respiratory tract infection - Will treat conservatively for now - Patient advised to start over-the-counter cold and flu remedies as needed, warm drinks with honey for sore throat - Patient will return if symptoms do not improve in another week or she develops fevers and if this happens, will consider antibiotics at that time    Cynthia [name] A. Lincoln Brigham MD, [facility] Resident  PGY-3 Pager [phone]

## 2016-08-16 NOTE — Patient Instructions (Addendum)
Follow up in 1 week if your symptoms have not improved Make an appointment for a pap smear Take Alka-Seltzer cold and flu as needed for cold symptoms Take honey with tea to help with sore throat If you have questions or concerns call the office at [phone]

## 2016-08-16 NOTE — Assessment & Plan Note (Signed)
1 week of congestion, sore throat, without fevers. Benign exam with normal vital signs. Strep test was repeated given concern for worsening sore throat. Strep was negative. Physical exam and history consistent with acute viral upper respiratory tract infection - Will treat conservatively for now - Patient advised to start over-the-counter cold and flu remedies as needed, warm drinks with honey for sore throat - Patient will return if symptoms do not improve in another week or she develops fevers and if this happens, will consider antibiotics at that time

## 2016-08-17 DIAGNOSIS — H5213 Myopia, bilateral: Secondary | ICD-10-CM | POA: Diagnosis not present

## 2016-08-31 DIAGNOSIS — F411 Generalized anxiety disorder: Secondary | ICD-10-CM | POA: Diagnosis not present

## 2016-09-08 DIAGNOSIS — K5010 Crohn's disease of large intestine without complications: Secondary | ICD-10-CM | POA: Diagnosis not present

## 2016-09-08 DIAGNOSIS — Z1211 Encounter for screening for malignant neoplasm of colon: Secondary | ICD-10-CM | POA: Diagnosis not present

## 2016-09-08 DIAGNOSIS — J45909 Unspecified asthma, uncomplicated: Secondary | ICD-10-CM | POA: Diagnosis not present

## 2016-09-08 DIAGNOSIS — K6389 Other specified diseases of intestine: Secondary | ICD-10-CM | POA: Diagnosis not present

## 2016-09-08 DIAGNOSIS — K5140 Inflammatory polyps of colon without complications: Secondary | ICD-10-CM | POA: Diagnosis not present

## 2016-09-08 DIAGNOSIS — Z79899 Other long term (current) drug therapy: Secondary | ICD-10-CM | POA: Diagnosis not present

## 2016-09-08 DIAGNOSIS — Z049 Encounter for examination and observation for unspecified reason: Secondary | ICD-10-CM | POA: Diagnosis not present

## 2016-09-08 DIAGNOSIS — K648 Other hemorrhoids: Secondary | ICD-10-CM | POA: Diagnosis not present

## 2016-09-11 MED FILL — STELARA 90 MG/ML SOSY: 90 | 42 days supply | Qty: 1 | Fill #3

## 2016-09-11 MED FILL — METHOTREXATE 2.5 MG TABLET: 2.5 | 84 days supply | Qty: 72 | Fill #0

## 2016-09-27 MED FILL — SERTRALINE HCL 50 MG TABLET: 50 | 30 days supply | Qty: 45 | Fill #1

## 2016-10-03 DIAGNOSIS — F411 Generalized anxiety disorder: Secondary | ICD-10-CM | POA: Diagnosis not present

## 2016-10-03 MED FILL — FOLIC ACID 1 MG TABLET: 1 | 90 days supply | Qty: 90 | Fill #0

## 2016-10-24 ENCOUNTER — Telehealth: Payer: Self-pay | Admitting: Student

## 2016-10-24 MED FILL — STELARA 90 MG/ML SOSY: 90 | 30 days supply | Qty: 1 | Fill #4

## 2016-10-24 NOTE — Telephone Encounter (Signed)
Will forward to MD to advise. Jazmin Hartsell,CMA  

## 2016-10-24 NOTE — Telephone Encounter (Signed)
Pt is calling because she is getting married in a couple of weeks and would like to start [facility] but she takes Zoloft and wasn't sure what her options would be. Please call patient to discuss. jw

## 2016-10-25 NOTE — Telephone Encounter (Signed)
Patient was called regarding request to discuss contraception. She did not answer so a message was left asking her to make an appointment to discuss her options. Please schedule an appointment for her to see me. It is OK to overbook her if my schedule is full

## 2016-11-03 ENCOUNTER — Ambulatory Visit (INDEPENDENT_AMBULATORY_CARE_PROVIDER_SITE_OTHER): Payer: 59 | Admitting: Student

## 2016-11-03 ENCOUNTER — Encounter: Payer: Self-pay | Admitting: Student

## 2016-11-03 VITALS — BP 98/62 | HR 52 | Temp 97.7°F | Wt 120.0 lb

## 2016-11-03 DIAGNOSIS — Z3009 Encounter for other general counseling and advice on contraception: Secondary | ICD-10-CM | POA: Diagnosis not present

## 2016-11-03 DIAGNOSIS — Z309 Encounter for contraceptive management, unspecified: Secondary | ICD-10-CM | POA: Diagnosis not present

## 2016-11-03 LAB — POCT URINE PREGNANCY: Preg Test, Ur: NEGATIVE

## 2016-11-03 MED ORDER — NORGESTIMATE-ETH ESTRADIOL 0.25-35 MG-MCG PO TABS: 1.0000 | ORAL_TABLET | Freq: Every day | ORAL | 11 refills | Status: DC

## 2016-11-03 NOTE — Assessment & Plan Note (Addendum)
Urine pregnancy is negative. Discussed various contraceptive options including LARC methods, OCPs, NuvaRing, etc. She initially desired OCPs but is considering Mirena. She wishes to discuss this with her fianc and returned for Mirena if she decides this. Strongly counseled to abstain from intercourse until she starts contraception.  Contraceptive handout provided. Patient will follow-up with her final decision.Cynthia [name]

## 2016-11-03 NOTE — Patient Instructions (Addendum)
Follow-up after considering what contraceptive options you would like DO NOT HAVE SEX or use condoms until you start contraception If you've any questions please call the office at [phone]

## 2016-11-03 NOTE — Progress Notes (Signed)
   Subjective:    Patient ID: Cynthia [name], female    DOB: [birth year], 36 y.o.   MRN: [id number]   CC: Discuss contraception  HPI: 36 year old female with history of Crohn's disease presents to discuss contraception  Discuss contraception - She is on Crohn's and takes methotrexate and Stelara - She wishes to discuss contraception options she has and is worried about interactions with her medications - She plans to get married in July of this year and is not currently sexually active with her fianc - She has been on a combined OCP before prior to starting methotrexate still are a - Patient's last menstrual period was 10/27/2016.   Smoking status reviewed  Review of Systems  Per HPI, else denies recent illness, fever, chest pain, shortness of breath   Objective:  BP 98/62   Pulse (!) 52   Temp 97.7 F (36.5 C) (Oral)   Wt 120 lb (54.4 kg)   LMP 10/27/2016   SpO2 99%   BMI 19.97 kg/m  Vitals and nursing note reviewed  General: NAD Cardiac: RRR,  Respiratory: CTAB, normal effort Skin: warm and dry, no rashes noted Neuro: alert and oriented  Upreg: negative  Assessment & Plan:    Counseling for birth control, oral contraceptives Urine pregnancy is negative. Discussed various contraceptive options including LARC methods, OCPs, NuvaRing, etc. She initially desired OCPs but is considering Mirena. She wishes to discuss this with her fianc and returned for Mirena if she decides this. Contraceptive handout provided. Patient will follow-up with her final decision.Yetta Flock A. Lincoln Brigham MD, [facility] Resident PGY-3 Pager [phone]

## 2016-11-07 DIAGNOSIS — F411 Generalized anxiety disorder: Secondary | ICD-10-CM | POA: Diagnosis not present

## 2016-11-13 MED FILL — SERTRALINE HCL 50 MG TABLET: 50 | 30 days supply | Qty: 45 | Fill #2

## 2016-11-14 ENCOUNTER — Ambulatory Visit (INDEPENDENT_AMBULATORY_CARE_PROVIDER_SITE_OTHER): Payer: 59 | Admitting: Student

## 2016-11-14 ENCOUNTER — Encounter: Payer: Self-pay | Admitting: Student

## 2016-11-14 VITALS — BP 98/59 | HR 97 | Temp 97.8°F | Wt 120.0 lb

## 2016-11-14 DIAGNOSIS — Z309 Encounter for contraceptive management, unspecified: Secondary | ICD-10-CM

## 2016-11-14 DIAGNOSIS — Z3009 Encounter for other general counseling and advice on contraception: Secondary | ICD-10-CM | POA: Diagnosis not present

## 2016-11-14 LAB — POCT URINE PREGNANCY: PREG TEST UR: NEGATIVE

## 2016-11-14 NOTE — Progress Notes (Signed)
URINE   

## 2016-11-14 NOTE — Patient Instructions (Signed)
Follow up as needed for contraception If you have intercourse USE A CONDOM!! If you have questions or concerns, call the office at [phone]

## 2016-11-14 NOTE — Progress Notes (Signed)
   Subjective:    Patient ID: Cynthia [name], female    DOB: 16-May-1981, 36 y.o.   MRN: [id number]   CC: contraception, desire for mirena  HPI: 36 y/o F presents for desire for mirena IUD  Mirena IUD - denies being sexually active - Patient's last menstrual period was 10/27/2016. - denies vaginal irritation or discharge, abdominal pain or dysuria  Smoking status reviewed  Review of Systems  Per HPI, else denies recent illness, fever, headache, chest pain, shortness of breath, abdominal pain, N/V/D, weakness    Objective:  BP (!) 98/59   Pulse 97   Temp 97.8 F (36.6 C) (Oral)   Wt 120 lb (54.4 kg)   LMP 10/27/2016   SpO2 99%   BMI 19.97 kg/m  Vitals and nursing note reviewed  General: NAD Cardiac: RRR, Respiratory: CTAB, normal effort Abdomen: soft, nontender, nondistended, no hepatic or splenomegaly. Bowel sounds present Extremities: no edema or cyanosis. WWP. Skin: warm and dry, no rashes noted Neuro: alert and oriented, no focal deficits  Pelvic: Normal EGBUS, normal vaginal, normal cervix with no CMT, normal mobile uterus, normal adnexa with no masses, no adnexal tenderness     Assessment & Plan:    Counseling for birth control, oral contraceptives Mirena unable to be placed (see procedure note) - OCPs offered but she declines at this time - advised for condom use condom use with sexual activity  - will follow up for contraception     Esaul Dorwart A. Lincoln Brigham MD, [facility] Resident PGY-3 Pager [phone]

## 2016-11-14 NOTE — Progress Notes (Signed)
   IUD Insertion Procedure Note Patient identified, informed consent performed.  Discussed risks of irregular bleeding, cramping, infection, malpositioning or misplacement of the IUD outside the uterus which may require further procedure such as laparoscopy. Time out was performed.  Urine pregnancy test negative.  Speculum placed in the vagina.  Cervix visualized.  Cleaned with Betadine x 2.  Grasped anteriorly with a single tooth tenaculum.  Uterus sounded to 7 cm.  Mirena IUD insertion device was unable to be inserted beyond 4 cm which is consistent with the internal os.  Patient tolerated procedure well.   Patient was given post-procedure instructions.  And advised to use condoms until adequate contraception    Annalaya Wile A. Lincoln Brigham MD, [facility] Resident PGY-3 Pager [phone]

## 2016-11-14 NOTE — Assessment & Plan Note (Addendum)
Mirena unable to be placed (see procedure note) - OCPs offered but she declines at this time - advised for condom use condom use with sexual activity  - will follow up for contraception

## 2016-11-15 MED ORDER — LEVONORGESTREL 20 MCG/24HR IU IUD
INTRAUTERINE_SYSTEM | Freq: Once | INTRAUTERINE | Status: AC
  Administered 2016-11-14: 1 via INTRAUTERINE

## 2016-11-15 NOTE — Addendum Note (Signed)
Addended by: Christen Bame D on: 11/15/2016 07:31 AM   Modules accepted: Orders

## 2016-12-05 DIAGNOSIS — F411 Generalized anxiety disorder: Secondary | ICD-10-CM | POA: Diagnosis not present

## 2016-12-05 DIAGNOSIS — K5090 Crohn's disease, unspecified, without complications: Secondary | ICD-10-CM | POA: Diagnosis not present

## 2016-12-07 MED FILL — METHOTREXATE 2.5 MG TABLET: 2.5 | 84 days supply | Qty: 72 | Fill #0

## 2016-12-12 DIAGNOSIS — Z30013 Encounter for initial prescription of injectable contraceptive: Secondary | ICD-10-CM | POA: Diagnosis not present

## 2016-12-19 MED FILL — STELARA 90 MG/ML SOSY: 90 | 30 days supply | Qty: 1 | Fill #5

## 2016-12-26 DIAGNOSIS — F411 Generalized anxiety disorder: Secondary | ICD-10-CM | POA: Diagnosis not present

## 2017-01-01 MED FILL — FOLIC ACID 1 MG TABLET: 1 | 90 days supply | Qty: 90 | Fill #1

## 2017-01-01 MED FILL — SERTRALINE HCL 50 MG TABLET: 50 | 30 days supply | Qty: 45 | Fill #3

## 2017-01-09 ENCOUNTER — Encounter: Payer: Self-pay | Admitting: Student

## 2017-01-15 DIAGNOSIS — Z3200 Encounter for pregnancy test, result unknown: Secondary | ICD-10-CM | POA: Diagnosis not present

## 2017-01-15 DIAGNOSIS — Z30017 Encounter for initial prescription of implantable subdermal contraceptive: Secondary | ICD-10-CM | POA: Diagnosis not present

## 2017-01-23 DIAGNOSIS — F411 Generalized anxiety disorder: Secondary | ICD-10-CM | POA: Diagnosis not present

## 2017-01-30 DIAGNOSIS — K50119 Crohn's disease of large intestine with unspecified complications: Secondary | ICD-10-CM | POA: Diagnosis not present

## 2017-01-30 MED FILL — CIPROFLOXACIN HCL 500 MG TA: 500 | 10 days supply | Qty: 20 | Fill #0

## 2017-02-05 MED FILL — STELARA 90 MG/ML SOSY: 90 | 30 days supply | Qty: 1 | Fill #6

## 2017-02-07 DIAGNOSIS — N898 Other specified noninflammatory disorders of vagina: Secondary | ICD-10-CM | POA: Diagnosis not present

## 2017-02-07 DIAGNOSIS — N9089 Other specified noninflammatory disorders of vulva and perineum: Secondary | ICD-10-CM | POA: Diagnosis not present

## 2017-02-07 DIAGNOSIS — N94810 Vulvar vestibulitis: Secondary | ICD-10-CM | POA: Diagnosis not present

## 2017-02-07 DIAGNOSIS — N94819 Vulvodynia, unspecified: Secondary | ICD-10-CM | POA: Diagnosis not present

## 2017-02-13 MED FILL — SERTRALINE HCL 50 MG TABLET: 50 | 30 days supply | Qty: 45 | Fill #4

## 2017-02-15 DIAGNOSIS — Z975 Presence of (intrauterine) contraceptive device: Secondary | ICD-10-CM | POA: Diagnosis not present

## 2017-02-22 MED FILL — valACYclovir HCL 1 GM TABS: 1 | 30 days supply | Qty: 30 | Fill #0

## 2017-02-27 DIAGNOSIS — F411 Generalized anxiety disorder: Secondary | ICD-10-CM | POA: Diagnosis not present

## 2017-03-06 DIAGNOSIS — K5090 Crohn's disease, unspecified, without complications: Secondary | ICD-10-CM | POA: Diagnosis not present

## 2017-03-20 ENCOUNTER — Other Ambulatory Visit: Payer: Self-pay | Admitting: Internal Medicine

## 2017-03-20 ENCOUNTER — Other Ambulatory Visit: Payer: Self-pay | Admitting: Pharmacist

## 2017-03-20 DIAGNOSIS — F411 Generalized anxiety disorder: Secondary | ICD-10-CM | POA: Diagnosis not present

## 2017-03-20 MED ORDER — USTEKINUMAB 90 MG/ML ~~LOC~~ SOSY: 90.0000 mg | PREFILLED_SYRINGE | SUBCUTANEOUS | 8 refills | Status: DC

## 2017-03-20 MED FILL — STELARA 90 MG/ML SOSY: 90 | 30 days supply | Qty: 1 | Fill #0

## 2017-03-30 DIAGNOSIS — F411 Generalized anxiety disorder: Secondary | ICD-10-CM | POA: Diagnosis not present

## 2017-04-02 MED FILL — SERTRALINE HCL 50 MG TABLET: 50 | 30 days supply | Qty: 45 | Fill #5

## 2017-04-02 MED FILL — FOLIC ACID 1 MG TABLET: 1 | 90 days supply | Qty: 90 | Fill #2

## 2017-04-24 DIAGNOSIS — F411 Generalized anxiety disorder: Secondary | ICD-10-CM | POA: Diagnosis not present

## 2017-05-01 MED FILL — STELARA 90 MG/ML SOSY: 90 | 30 days supply | Qty: 1 | Fill #1

## 2017-05-08 ENCOUNTER — Encounter: Payer: Self-pay | Admitting: Physical Therapy

## 2017-05-08 ENCOUNTER — Ambulatory Visit: Payer: 59 | Attending: Obstetrics and Gynecology | Admitting: Physical Therapy

## 2017-05-08 DIAGNOSIS — M533 Sacrococcygeal disorders, not elsewhere classified: Secondary | ICD-10-CM | POA: Insufficient documentation

## 2017-05-08 DIAGNOSIS — R29898 Other symptoms and signs involving the musculoskeletal system: Secondary | ICD-10-CM | POA: Diagnosis not present

## 2017-05-08 DIAGNOSIS — M791 Myalgia, unspecified site: Secondary | ICD-10-CM

## 2017-05-08 DIAGNOSIS — F411 Generalized anxiety disorder: Secondary | ICD-10-CM | POA: Diagnosis not present

## 2017-05-08 NOTE — Patient Instructions (Signed)
Prone Heel Press for strengthening sacro-iliac joints  1. Lie on your belly. If you have an arch in your low back or it feels umcomfortable, place a pillow under your low belly/hips to make sure your low back feel comfortable.   2. Place our forehead on top of your palms.      Widen your knees apart for starting position.   3. Inhale, feel belly and low back expand  4. Exhale, feel belly hug in, press heel together and count aloud for 5 sec. Then relax the heel squeezing.  Perform 10 reps of 5 sec holds. 2 sets/ day.    If you feel entire buttock tighten too much or feel low back pain, apply 50% less effort. As you press your heel together, you will feel as if your pubic bone (front of your pelvis) and sacrum (back of your pelvis) gentle move towards each other or your low abdominal muscles hug in more.        ________  Alternative with    Frog stretch: laying on belly with pillow under hips, knees bent, inhale do nothing, exhale let ankles fall apart  5  reps       ___________  10 reps of both    In morning/ night  ____________  Withhold sit-up/ crunches   ____________  Toileting mechanics to not strain

## 2017-05-09 NOTE — Addendum Note (Signed)
Addended by: [name] on: 05/09/2017 10:10 PM   Modules accepted: Orders

## 2017-05-09 NOTE — Therapy (Addendum)
[place] MAIN [facility] SERVICES [address] [place], [place], [address] Phone: [phone]   Fax:  [phone]  Physical Therapy Evaluation  Patient Details  Name: Cynthia [name] MRN: [id number] Date of Birth: 1981/06/12 Referring Provider: Leafy Ro  Encounter Date: 05/08/2017      PT End of Session - 05/09/17 1804    Visit Number 1   Number of Visits 12   Date for PT Re-Evaluation 07/24/17   PT Start Time 1330   PT Stop Time 1440   PT Time Calculation (min) 70 min   Activity Tolerance Patient tolerated treatment well;No increased pain      Past Medical History:  Diagnosis Date  . Crohn disease ([place])     History reviewed. No pertinent surgical history.  There were no vitals filed for this visit.       Subjective Assessment - 05/08/17 1344    Subjective 1) Pelvic pain with gynecological exams and functional activities. Pt was not able undergone IUD implant in Feb 2018 and was explained that her cervix could not dilate and pt tried to relax her pelvic floor muscles. Since then, pt also has found it difficult to relax her pelvic floor muscles. Pt has had constipation over past 2 months which is abnormal, leading her to take stool softners. prior to this time, pt has had daily bowel movments daily Type 4-5 Stool Type (Bristol Stool)  and now, is having Type 3 without stool softners.    Pt has to strain to eliminate. Denied  pain nor difficulty with urination. Dx with Chrons at the age of 36 years. For the past 16-17 years, pt has had moderate to severe flare-ups ( involves weightloss, gas, diarrhea, nausea, rectal bleeding)  leading to 2 hospitalizations. Pt has not any surgeries. In Dec 2017, pt 's colonscopy showed no inflammation and is currently taking medications that have kept her flare-ups down.  Hx of anal fistula during flare-up which resolved with high dose of prednisone. Pt notices there is scar tissue in the area now.  Hx of fall onto  SIJ April 2018.  Pt currently works with a Physiological scientist and performs crunches and other abdominal work 3-4 x / week.  Pt also has pain with sexual intercourse. MD has prescribed her a cream which has helped but pt would like to not relay on it.     2) Tensions in neck/ shoulders and chronic HA:  Family of Hx of migraines in mother and grandmother. Most recent HA lasts 3day period, triggered with computer work and stress/ muscle tension/ eye tensions    Pertinent History No pregnancies, abdominal/ spinal surgeries.   Hx of anxiety, managed with pyshcotherapy, running, reading, gently yoga,walking. Waiting to f/u with [name], NP, re: low grade bleeding since the birth control impant     Patient Stated Goals Figure out where the pain is coming from.  Not have to use the cream             [facility] PT Assessment - 05/09/17 1803      Assessment   Medical Diagnosis Vulvodynia   Referring Provider Leafy Ro     Precautions   Precautions None     Restrictions   Weight Bearing Restrictions No     Balance Screen   Has the patient fallen in the past 6 months Yes   How many times? --  1, fell onto tailbone   Has the patient had a decrease in activity level because  of a fear of falling?  No   Is the patient reluctant to leave their home because of a fear of falling?  No     Single Leg Stance   Comments Pre Tx R hip/flex, limited R SIJ mobility, post Tx:      Palpation   SI assessment  R ASIS more inferior      PROM R hip ER  Pre Tx:~20 deg, Post Tx ~45 deg  Tightness/ Tenderness at R obturator inturnus, coccgyeus       Objective measurements completed on examination: See above findings.        Pelvic Floor Special Questions - 05/09/17 1807    Diastasis Recti 3 fingers width, deep cavitation at sternocostal angle.    External Perineal Exam through clothing, increased tenderness/ tightness at urogenital triangle B           [facility] Adult PT Treatment/Exercise - 05/08/17  1435      Exercises   Exercises --  see pt instructions     Manual Therapy   Manual therapy comments long axis distraction supine, sidelying RLE. Rotation mob, inferior/ superior mob of sacrum, MWM, hip flexion/ clams                 PT Education - 05/08/17 1438    Education provided Yes   Education Details POC, anatomy. physiology, goals, HEP   Person(s) Educated Patient   Methods Explanation;Demonstration;Tactile cues;Verbal cues;Handout   Comprehension Returned demonstration;Verbalized understanding             PT Long Term Goals - 05/09/17 2148      PT LONG TERM GOAL #1   Title Pt will decrease her COREFO score decrease from 24% to < 17% in order to improve pelvic function   Time 12   Period Weeks   Status New   Target Date 07/25/17     PT LONG TERM GOAL #2   Title Pt will report Bristol Stool Type 4 for 75% of the time without stool softeners across 1 week  in order to decrease straining with bowel movements and perseve pelvic health   Time 12   Period Weeks   Status New   Target Date 07/25/17     PT LONG TERM GOAL #3   Title Pt will decrease her [place] score from 60% to < 30% in order to return to ADLs and QOL   Time 12   Period Weeks   Status New   Target Date 07/25/17     PT LONG TERM GOAL #4   Title Pt will demo less than 1 fingers width abdominal separation and less cavitation below sternum in order to improve intraabdominal pressure for optimal motility and less pelvic pain   Time 12   Period Weeks   Status New   Target Date 05/30/17     PT LONG TERM GOAL #5   Title pt will demo proper alignment/ modifications to fitness routine in order to minimize straining pelvic floor and abdominal muscles   Time 12   Period Weeks   Status New   Target Date 07/25/17                Plan - 05/09/17 1805    Clinical Impression Statement Pt is a 36 yo female who complains of pelvic pain, constipation, neck / shoulder pain, and HAs. Pt's deficits  impact her QOL and ADLs. Contributing factors include Hx of fall, anal fistula, Chron's Disease flare-ups, and fitness routine that  includes core exercises such as sit-ups/ crunches. Pt's clinical presentations include limited spinal mobility, diastasis recti, dyscoordination and strength of pelvic floor mm, pelvic floor tightness R> L, sacroiliac dysfunction, decreased hip mobility R > L,  poor body mechanics which places strain on the abdominal/pelvic floor mm. These are deficits that indicate an ineffective intraabdominal pressure system associated with increased risk for pelvic floor dysfunction and poor motility.  Following Tx today, pt showed improved R SIJ arthrokinematics, a more symmetrically aligned pelvic girdle , improved R  active hip ext and PROM hip ER, and less pelvic floor tightness/tenderness. Pt will benefit from coordination training and education on fitness and functional positions in order to gain a more effective intraabdominal pressure system to minimize pelvic pain. Pt will be assessed further for neck/shoulder pain/ HA at future sessions. Plan to perform internal pelvic floor assessment at next session.       Clinical Presentation Evolving   Clinical Decision Making Moderate   Rehab Potential Good   PT Frequency 1x / week   PT Duration 12 weeks   PT Treatment/Interventions Moist Heat;Neuromuscular re-education;Therapeutic exercise;Therapeutic activities;Functional mobility training;Manual techniques;Scar mobilization;Aquatic Therapy;Stair training;Patient/family education   Consulted and Agree with Plan of Care Patient      Patient will benefit from skilled therapeutic intervention in order to improve the following deficits and impairments:  Decreased coordination, Decreased safety awareness, Postural dysfunction, Decreased range of motion, Improper body mechanics, Decreased mobility, Increased muscle spasms, Pain, Increased fascial restricitons, Decreased activity tolerance,  Decreased endurance  Visit Diagnosis: Sacrococcygeal disorders, not elsewhere classified  Other symptoms and signs involving the musculoskeletal system  Myalgia     Problem List Patient Active Problem List   Diagnosis Date Noted  . Counseling for birth control, oral contraceptives 11/03/2016  . Acute upper respiratory infection 08/16/2016  . Anxiety state 01/24/2015  . Crohn's disease ([place]) 01/24/2015  . [place] INTESTINE 07/06/2010  . WEIGHT LOSS-ABNORMAL 07/06/2010  . DIARRHEA 07/06/2010  . NONSPEC ELEVATION OF LEVELS OF TRANSAMINASE/LDH 01/13/2009  . EXERCISE INDUCED ASTHMA 03/18/2008  . CROHN'S DISEASE 03/18/2008  . ANAL FISTULA 03/18/2008  . PSORIASIS 03/18/2008  . OSTEOPENIA 03/18/2008  . ANEMIA, HX OF 03/18/2008    [name] ,PT, DPT, E-RYT  05/09/2017, 9:54 PM  [place] MAIN [facility] SERVICES [address] [place], [place], [address] Phone: [phone]   Fax:  [phone]  Name: KELITA WALLIS MRN: [id number] Date of Birth: [birth year]

## 2017-05-15 ENCOUNTER — Ambulatory Visit: Payer: 59 | Admitting: Physical Therapy

## 2017-05-15 DIAGNOSIS — M791 Myalgia, unspecified site: Secondary | ICD-10-CM

## 2017-05-15 DIAGNOSIS — R29898 Other symptoms and signs involving the musculoskeletal system: Secondary | ICD-10-CM | POA: Diagnosis not present

## 2017-05-15 DIAGNOSIS — M533 Sacrococcygeal disorders, not elsewhere classified: Secondary | ICD-10-CM

## 2017-05-15 NOTE — Therapy (Signed)
[place] MAIN [facility] SERVICES [address] [place], [place], [address] Phone: [phone]   Fax:  [phone]  Physical Therapy Treatment  Patient Details  Name: Cynthia [name] MRN: [id number] Date of Birth: June 03, 1981 Referring Provider: Leafy [name]  Encounter Date: 05/15/2017      PT End of Session - 05/15/17 1733    Visit Number 2   Number of Visits 12   Date for PT Re-Evaluation 07/24/17   PT Start Time 2831   PT Stop Time 1730   PT Time Calculation (min) 53 min   Activity Tolerance Patient tolerated treatment well;No increased pain      Past Medical History:  Diagnosis Date  . Crohn disease ([place])     No past surgical history on file.  There were no vitals filed for this visit.      Subjective Assessment - 05/15/17 1641    Subjective Pt reported the toileting method is helping her to not have to use stool softeners for 3 days    Pertinent History No pregnancies, abdominal/ spinal surgeries.   Hx of anxiety, managed with pyshcotherapy, running, reading, gently yoga,walking. Waiting to f/u with Cynthia Good, NP, re: low grade bleeding since the birth control impant     Patient Stated Goals Figure out where the pain is coming from.  Not have to use the cream             [facility] PT Assessment - 05/15/17 1720      Palpation   Spinal mobility hyomobility at T3-5, T10-12, T12 deviated to L with point tenderness, interspinales R increased tensions,    SI assessment  ASIS symmetrical B                   Pelvic Floor Special Questions - 05/15/17 1721    Diastasis Recti less cavitation at sternocostal angle ,    External Perineal Exam no mm tensions at urogenital triangle            [facility] Adult PT Treatment/Exercise - 05/15/17 1717      Neuro Re-ed    Neuro Re-ed Details  see pt instructions      Exercises   Exercises --  see pt instructions     Manual Therapy   Manual therapy comments sidelying: L lateral to  medial mob along T10-12, prone, PA mobs Grade III mob at T3,-5 with MWM low cobra   fascial release over SCJ distal, intercostals rib 10-12                PT Education - 05/15/17 1732    Education provided Yes   Education Details HEP, modifying work routine, adding work stretches to minimize spinal tensions   Person(s) Educated Patient   Methods Explanation;Demonstration;Tactile cues;Verbal cues;Handout   Comprehension Verbalized understanding;Returned demonstration;Verbal cues required             PT Long Term Goals - 05/15/17 1642      PT LONG TERM GOAL #1   Title Pt will decrease her COREFO score decrease from 24% to < 17% in order to improve pelvic function   Time 12   Period Weeks   Status On-going     PT LONG TERM GOAL #2   Title Pt will report [place] Stool Type 3-4 for 75% of the time without stool softeners across 1 week  in order to decrease straining with bowel movements and perseve pelvic health   Time 12   Period Weeks  Status On-going     PT LONG TERM GOAL #3   Title Pt will decrease her [place] score from 60% to < 30% in order to return to ADLs and QOL   Time 12   Period Weeks   Status On-going     PT LONG TERM GOAL #4   Title Pt will demo less than 1 fingers width abdominal separation and less cavitation below sternum in order to improve intraabdominal pressure for optimal motility and less pelvic pain   Time 12   Period Weeks   Status On-going     PT LONG TERM GOAL #5   Title pt will demo proper alignment/ modifications to fitness routine in order to minimize straining pelvic floor and abdominal muscles   Time 12   Period Weeks   Status On-going               Plan - 05/15/17 1733    Clinical Impression Statement Pt demo'd less abdominal separation, increased thoracic mobility and tenderness, lateral diaphragmatic excursion, and improve deep core coordination following Tx today. Pt demo'd no pelvic floor mm tensions as well. Pt  continues to benefit from skilled PT.    Rehab Potential [name]   PT Frequency 1x / week   PT Duration 12 weeks   PT Treatment/Interventions Moist Heat;Neuromuscular re-education;Therapeutic exercise;Therapeutic activities;Functional mobility training;Manual techniques;Scar mobilization;Aquatic Therapy;Stair training;Patient/family education   Consulted and Agree with Plan of Care Patient      Patient will benefit from skilled therapeutic intervention in order to improve the following deficits and impairments:  Decreased coordination, Decreased safety awareness, Postural dysfunction, Decreased range of motion, Improper body mechanics, Decreased mobility, Increased muscle spasms, Pain, Increased fascial restricitons, Decreased activity tolerance, Decreased endurance  Visit Diagnosis: Sacrococcygeal disorders, not elsewhere classified  Other symptoms and signs involving the musculoskeletal system  Myalgia     Problem List Patient Active Problem List   Diagnosis Date Noted  . Counseling for birth control, oral contraceptives 11/03/2016  . Acute upper respiratory infection 08/16/2016  . Anxiety state 01/24/2015  . Crohn's disease ([place]) 01/24/2015  . [place] INTESTINE 07/06/2010  . WEIGHT LOSS-ABNORMAL 07/06/2010  . DIARRHEA 07/06/2010  . NONSPEC ELEVATION OF LEVELS OF TRANSAMINASE/LDH 01/13/2009  . EXERCISE INDUCED ASTHMA 03/18/2008  . CROHN'S DISEASE 03/18/2008  . ANAL FISTULA 03/18/2008  . PSORIASIS 03/18/2008  . OSTEOPENIA 03/18/2008  . ANEMIA, HX OF 03/18/2008    Cynthia [name] ,PT, DPT, E-RYT  05/15/2017, 5:36 PM  [place] MAIN [facility] SERVICES [address] [place], [place], [address] Phone: [phone]   Fax:  [phone]  Name: Cynthia [name] MRN: [id number] Date of Birth: 1981-06-17

## 2017-05-15 NOTE — Patient Instructions (Signed)
Thoracic releases at home:   Cat cow  childs pose rocking    At work:   wall low cobra -> chils pose standing, lateral flexion      wall lateral flexion for intercostals    Gym:  modify and variate with less planks/ pushups and add side planks, reverse planks to open chest and increase thoracic ext    ___  Deep core level 1-2 ( handout)

## 2017-05-21 ENCOUNTER — Other Ambulatory Visit: Payer: Self-pay | Admitting: *Deleted

## 2017-05-22 ENCOUNTER — Ambulatory Visit: Payer: 59 | Admitting: Physical Therapy

## 2017-05-22 DIAGNOSIS — R29898 Other symptoms and signs involving the musculoskeletal system: Secondary | ICD-10-CM

## 2017-05-22 DIAGNOSIS — M533 Sacrococcygeal disorders, not elsewhere classified: Secondary | ICD-10-CM

## 2017-05-22 DIAGNOSIS — M791 Myalgia, unspecified site: Secondary | ICD-10-CM

## 2017-05-22 NOTE — Therapy (Signed)
[place] MAIN [facility] SERVICES [address] [place], [place], [address] Phone: [phone]   Fax:  [phone]  Physical Therapy Treatment  Patient Details  Name: Cynthia [name] MRN: [id number] Date of Birth: 1980-12-02 Referring Provider: Leafy Ro  Encounter Date: 05/22/2017      PT End of Session - 05/22/17 1648    Visit Number 3   Number of Visits 12   Date for PT Re-Evaluation 07/24/17   PT Start Time 3335   PT Stop Time 1630   PT Time Calculation (min) 60 min   Activity Tolerance Patient tolerated treatment well;No increased pain      Past Medical History:  Diagnosis Date  . Crohn disease ([place])     No past surgical history on file.  There were no vitals filed for this visit.      Subjective Assessment - 05/22/17 1539    Subjective Pt feels her coordination with the exercises is getting better. Pt f/u her GYN MD and will meet with her re: menses. Pt has f/u with GI MD re: recent nausea after eating .     Pertinent History No pregnancies, abdominal/ spinal surgeries.   Hx of anxiety, managed with pyshcotherapy, running, reading, gently yoga,walking. Waiting to f/u with [name], NP, re: low grade bleeding since the birth control impant     Patient Stated Goals Figure out where the pain is coming from.  Not have to use the cream             [facility] PT Assessment - 05/22/17 1619      Palpation   Spinal mobility increased diaphragmatic expansion   SI assessment  ASIS symmetrical B                   Pelvic Floor Special Questions - 05/22/17 1644    Pelvic Floor Internal Exam pt was explained details of exam, pt consented verbally without contraindcations   Exam Type Vaginal   Palpation increased tensions/ burning reports at 5 -7 o clock. Noted keloid scar at perineum.            [place] Adult PT Treatment/Exercise - 05/22/17 1646      Neuro Re-ed    Neuro Re-ed Details  mindfulness technique     Exercises    Exercises --  heel slides, hip IR supine, LE D2 fl posterior pelvic floor     Modalities   Modalities --  heat 15 min during body scan. posterior gluts, perineal.      Manual Therapy   Internal Pelvic Floor STM, MET, MWM 8-9 o clock ( lateral to areas of burning complaints which pt was able to tolerate better), and 3-5 o clock                      PT Long Term Goals - 05/15/17 1642      PT LONG TERM GOAL #1   Title Pt will decrease her COREFO score decrease from 24% to < 17% in order to improve pelvic function   Time 12   Period Weeks   Status On-going     PT LONG TERM GOAL #2   Title Pt will report [place] Stool Type 3-4 for 75% of the time without stool softeners across 1 week  in order to decrease straining with bowel movements and perseve pelvic health   Time 12   Period Weeks   Status On-going     PT LONG  TERM GOAL #3   Title Pt will decrease her [place] score from 60% to < 30% in order to return to ADLs and QOL   Time 12   Period Weeks   Status On-going     PT LONG TERM GOAL #4   Title Pt will demo less than 1 fingers width abdominal separation and less cavitation below sternum in order to improve intraabdominal pressure for optimal motility and less pelvic pain   Time 12   Period Weeks   Status On-going     PT LONG TERM GOAL #5   Title pt will demo proper alignment/ modifications to fitness routine in order to minimize straining pelvic floor and abdominal muscles   Time 12   Period Weeks   Status On-going               Plan - 05/22/17 1652    Clinical Impression Statement Pt showed increased ribcage expansion compared to last session which improve pelvic floor ROM. With pelvic assessment, pt demo'd increased pelvic floor and perineal scar restrictions. tenderness, and tensions which decreased post Tx. Pt continues to benefit from skilled PT and anticipate mobilization of perineal scar will help pt restore bowel function and decrease pelvic  pain.    Rehab Potential Good   PT Frequency 1x / week   PT Duration 12 weeks   PT Treatment/Interventions Moist Heat;Neuromuscular re-education;Therapeutic exercise;Therapeutic activities;Functional mobility training;Manual techniques;Scar mobilization;Aquatic Therapy;Stair training;Patient/family education   Consulted and Agree with Plan of Care Patient      Patient will benefit from skilled therapeutic intervention in order to improve the following deficits and impairments:  Decreased coordination, Decreased safety awareness, Postural dysfunction, Decreased range of motion, Improper body mechanics, Decreased mobility, Increased muscle spasms, Pain, Increased fascial restricitons, Decreased activity tolerance, Decreased endurance  Visit Diagnosis: Sacrococcygeal disorders, not elsewhere classified  Myalgia  Other symptoms and signs involving the musculoskeletal system     Problem List Patient Active Problem List   Diagnosis Date Noted  . Counseling for birth control, oral contraceptives 11/03/2016  . Acute upper respiratory infection 08/16/2016  . Anxiety state 01/24/2015  . Crohn's disease ([place]) 01/24/2015  . [place] INTESTINE 07/06/2010  . WEIGHT LOSS-ABNORMAL 07/06/2010  . DIARRHEA 07/06/2010  . NONSPEC ELEVATION OF LEVELS OF TRANSAMINASE/LDH 01/13/2009  . EXERCISE INDUCED ASTHMA 03/18/2008  . CROHN'S DISEASE 03/18/2008  . ANAL FISTULA 03/18/2008  . PSORIASIS 03/18/2008  . OSTEOPENIA 03/18/2008  . ANEMIA, HX OF 03/18/2008    [name] ,PT, DPT, E-RYT  05/22/2017, 4:54 PM  [place] MAIN [facility] SERVICES [address] [place], [place], [address] Phone: [phone]   Fax:  [phone]  Name: Cynthia [name] MRN: [id number] Date of Birth: [birth year]

## 2017-05-23 MED ORDER — SERTRALINE HCL 50 MG PO TABS: 75.0000 mg | ORAL_TABLET | Freq: Every day | ORAL | 5 refills | Status: DC

## 2017-05-23 MED FILL — SERTRALINE HCL 50 MG TABLET: 50 | 30 days supply | Qty: 45 | Fill #0

## 2017-05-29 ENCOUNTER — Ambulatory Visit: Payer: 59 | Admitting: Physical Therapy

## 2017-05-29 DIAGNOSIS — M791 Myalgia, unspecified site: Secondary | ICD-10-CM

## 2017-05-29 DIAGNOSIS — R29898 Other symptoms and signs involving the musculoskeletal system: Secondary | ICD-10-CM

## 2017-05-29 DIAGNOSIS — N939 Abnormal uterine and vaginal bleeding, unspecified: Secondary | ICD-10-CM | POA: Diagnosis not present

## 2017-05-29 DIAGNOSIS — M533 Sacrococcygeal disorders, not elsewhere classified: Secondary | ICD-10-CM | POA: Diagnosis not present

## 2017-05-29 NOTE — Patient Instructions (Addendum)
Discussed positions in functional activities and points for stability to control pelvic tilts and visualizing posterior pelvic lengthening    Yoga: Spiderwoman palms, lower cobra without squeezing gluts, ( pubic bone and tops of feet as points of contact)  Plank/ chataranga:  Distribute weight into back and front  And cervical retraction  Down dog: ER upper arms, hands little wider   Dolphin plank as an intermediary step to grow stronger for rise in chataranga with less lumbar lordosis    Stretching pubic area:  Double kneeling ( camel)  Half  Kneeling -->  Gate pose, trunk rotation  Mini low cobra  At work: hip flexor ( extend side angle)

## 2017-05-29 NOTE — Therapy (Signed)
[place] MAIN [facility] SERVICES [address] [place], [place], [address] Phone: [phone]   Fax:  [phone]  Physical Therapy Treatment  Patient Details  Name: Cynthia [name] MRN: [id number] Date of Birth: 06/10/1981 Referring Provider: Leafy Ro  Encounter Date: 05/29/2017      PT End of Session - 05/29/17 0941    Visit Number 4   Number of Visits 12   Date for PT Re-Evaluation 07/24/17   PT Start Time 0815   PT Stop Time 0935   PT Time Calculation (min) 80 min   Activity Tolerance Patient tolerated treatment well;No increased pain      Past Medical History:  Diagnosis Date  . Crohn disease ([place])     No past surgical history on file.  There were no vitals filed for this visit.      Subjective Assessment - 05/29/17 0818    Subjective Pt reports she still no longer uses stool softners. Pt is feeling sore in her L hip with sit to stand. Pt also reports she will be seeing her gynecologist today re: residual spotting from menstrual bleeding    Pertinent History No pregnancies, abdominal/ spinal surgeries.   Hx of anxiety, managed with pyshcotherapy, running, reading, gently yoga,walking. Waiting to f/u with [name], NP, re: low grade bleeding since the birth control impant     Patient Stated Goals Figure out where the pain is coming from.  Not have to use the cream             [facility] PT Assessment - 05/29/17 0930      Other:   Other/ Comments poor scap stabilization, lumbar lordosis, increased glut tightening in plank, low cobra,                  Pelvic Floor Special Questions - 05/29/17 3212    External Palpation increased scar restrictions at perineum L > R. - pernieum, ischioanal fossa.   Pelvic Floor Internal Exam pt was explained details of exam, pt consented verbally without contraindcations   Exam Type Vaginal   Palpation  noted shallow vaginal vault. increased tensions but no  burning reports at 5 -7 o  clock,  Reported dizziness as a stress response with cue to expand anal sphincter to activate posterior pelvic floor mm , PT withdrew digit, discontinued Tx.  Pt had no dizziness once dressed.    achieved lengthening at 1-3 layers.noted menstrual blood at tip of glove.            [facility] Adult PT Treatment/Exercise - 05/29/17 0933      Neuro Re-ed    Neuro Re-ed Details  see pt instructions      Manual Therapy   Internal Pelvic Floor STM, MET, MWM at problem areas ( internal and external Tx                 PT Education - 05/29/17 0939    Education provided Yes   Education Details HEP   Person(s) Educated Patient   Methods Explanation;Demonstration;Tactile cues;Verbal cues;Handout   Comprehension Returned demonstration;Verbalized understanding             PT Long Term Goals - 05/29/17 0948      PT LONG TERM GOAL #1   Title Pt will decrease her COREFO score decrease from 24% to < 17% in order to improve pelvic function   Time 12   Period Weeks   Status On-going     PT LONG  TERM GOAL #2   Title Pt will report [place] Stool Type 3-4 for 75% of the time without stool softeners across 1 week  in order to decrease straining with bowel movements and perseve pelvic health   Time 12   Period Weeks   Status Achieved     PT LONG TERM GOAL #3   Title Pt will decrease her [place] score from 60% to < 30% in order to return to ADLs and QOL   Time 12   Period Weeks   Status On-going     PT LONG TERM GOAL #4   Title Pt will demo less than 1 fingers width abdominal separation and less cavitation below sternum in order to improve intraabdominal pressure for optimal motility and less pelvic pain   Time 12   Period Weeks   Status Achieved     PT LONG TERM GOAL #5   Title pt will demo proper alignment/ modifications to fitness routine in order to minimize straining pelvic floor and abdominal muscles   Time 12   Period Weeks   Status On-going     Additional Long Term Goals    Additional Long Term Goals Yes     PT LONG TERM GOAL #6   Title Pt will demo no fascial / scar restrictions at perineum and will demo pelvic foor lengthening without stress response in order to return to ADLs   Time 12   Period Weeks   Status New               Plan - 05/29/17 6659    Clinical Impression Statement Pt demo'd less tenderness/ tensions with internal pelvic floor Tx and no report of burning pain which indicates improvement. Pt achieved increased pelvic floor lengthening following scar/ fascial release at perineum/ ischioanal fossa and posterior pelvic floor. Pt also reported decreased soreness in her L hip post Tx. Plan to address tensions at urogenital mm at next session.  Noted menstrual blood on glove following removal of digit after internal Tx. Pt reports she is seeing her gynecologist today about her menstrual cycle. Pt had stress response with dizziness when cued to expand anal sphincter at which pt discontinued internal Tx. Dizziness subsided and pt was able to continue with the rest of the HEP training. Given pt has had surgery for anal fissure, plan to perform anal assessment at a later appt when pt demonstrates readiness with verbal consent.    Provided education on stretches to perform in her yoga routine and ways to not overactivate her pelvic floor mm by engaging in scapular stabilizing/ deep core mm. Educated about the functional positions, sexual response cycle,  and the application of relaxation practice and hip movements to relax pelvic floor. Education material provided. Pt continues to benefit from skilled PT.     Rehab Potential Good   PT Frequency 1x / week   PT Duration 12 weeks   PT Treatment/Interventions Moist Heat;Neuromuscular re-education;Therapeutic exercise;Therapeutic activities;Functional mobility training;Manual techniques;Scar mobilization;Aquatic Therapy;Stair training;Patient/family education   Consulted and Agree with Plan of Care Patient       Patient will benefit from skilled therapeutic intervention in order to improve the following deficits and impairments:  Decreased coordination, Decreased safety awareness, Postural dysfunction, Decreased range of motion, Improper body mechanics, Decreased mobility, Increased muscle spasms, Pain, Increased fascial restricitons, Decreased activity tolerance, Decreased endurance  Visit Diagnosis: Myalgia  Sacrococcygeal disorders, not elsewhere classified  Other symptoms and signs involving the musculoskeletal system     Problem List  Patient Active Problem List   Diagnosis Date Noted  . Counseling for birth control, oral contraceptives 11/03/2016  . Acute upper respiratory infection 08/16/2016  . Anxiety state 01/24/2015  . Crohn's disease ([place]) 01/24/2015  . [place] INTESTINE 07/06/2010  . WEIGHT LOSS-ABNORMAL 07/06/2010  . DIARRHEA 07/06/2010  . NONSPEC ELEVATION OF LEVELS OF TRANSAMINASE/LDH 01/13/2009  . EXERCISE INDUCED ASTHMA 03/18/2008  . CROHN'S DISEASE 03/18/2008  . ANAL FISTULA 03/18/2008  . PSORIASIS 03/18/2008  . OSTEOPENIA 03/18/2008  . ANEMIA, HX OF 03/18/2008    [name] ,PT, DPT, E-RYT  05/29/2017, 9:50 AM  [place] MAIN [facility] SERVICES [address] [place], [place], [address] Phone: [phone]   Fax:  [phone]  Name: Cynthia [name] MRN: [id number] Date of Birth: March 05, 1981

## 2017-05-31 MED FILL — CIPROFLOXACIN HCL 500 MG TA: 500 | 14 days supply | Qty: 28 | Fill #0

## 2017-06-05 ENCOUNTER — Ambulatory Visit: Payer: 59 | Attending: Obstetrics and Gynecology | Admitting: Physical Therapy

## 2017-06-05 DIAGNOSIS — M533 Sacrococcygeal disorders, not elsewhere classified: Secondary | ICD-10-CM | POA: Insufficient documentation

## 2017-06-05 DIAGNOSIS — M791 Myalgia, unspecified site: Secondary | ICD-10-CM

## 2017-06-05 DIAGNOSIS — R29898 Other symptoms and signs involving the musculoskeletal system: Secondary | ICD-10-CM | POA: Insufficient documentation

## 2017-06-05 DIAGNOSIS — F411 Generalized anxiety disorder: Secondary | ICD-10-CM | POA: Diagnosis not present

## 2017-06-05 NOTE — Patient Instructions (Signed)
5 breaths each  ( perineum)   Dragon fly stretch with blocks     Half lotus, leaning forward, and twisting towards extended leg side Inverted version ( happy baby)     Giving birth pose leaning over  blocks  ( garland pose with heels raised to increase plantar fascia/ feet instrinsics)

## 2017-06-06 NOTE — Therapy (Signed)
[place] MAIN [facility] SERVICES [address] [place], [place], [address] Phone: [phone]   Fax:  [phone]  Physical Therapy Treatment  Patient Details  Name: Cynthia [name] MRN: [id number] Date of Birth: [birth year] Referring Provider: Leafy Ro  Encounter Date: 06/05/2017    Past Medical History:  Diagnosis Date  . Crohn disease ([place])     No past surgical history on file.  There were no vitals filed for this visit.      Subjective Assessment - 06/06/17 2008    Subjective Pt reported she has been using relaxation and breathing/ stretch practices to relieve pelvic pain. Pt has seen her gynecologist about menstrual bleeding and she had negative tests for anemia. Pt will be keeping her arm implant for birth control. Pt's bleeding did stop 2 days after her GYN visit.  Pt has been modifying her fitness routine and she notices a difference in her pelvic floor muscles.    Pertinent History No pregnancies, abdominal/ spinal surgeries.   Hx of anxiety, managed with pyshcotherapy, running, reading, gently yoga,walking. Waiting to f/u with [name], NP, re: low grade bleeding since the birth control impant     Patient Stated Goals Figure out where the pain is coming from.  Not have to use the cream                       Pelvic Floor Special Questions - 06/06/17 2009    Pelvic Floor Internal Exam pt was explained details of exam, pt consented verbally without contraindcations   Exam Type Vaginal   Palpation  no tenderness / minimal tensions at 6-8 o clock   achieved lengthening at 1-3 layers           [facility] Adult PT Treatment/Exercise - 06/06/17 2008      Exercises   Exercises --  heel slides, hip IR supine, LE D2 fl posterior pelvic floor     Manual Therapy   Internal Pelvic Floor STM, MET, MWM at problem areas ( internal and external Tx                 PT Education - 06/05/17 1427    Education provided Yes    Education Details HEP   Person(s) Educated Patient   Methods Explanation;Demonstration;Tactile cues;Verbal cues;Handout   Comprehension Returned demonstration;Verbalized understanding             PT Long Term Goals - 05/29/17 0948      PT LONG TERM GOAL #1   Title Pt will decrease her COREFO score decrease from 24% to < 17% in order to improve pelvic function   Time 12   Period Weeks   Status On-going     PT LONG TERM GOAL #2   Title Pt will report [place] Stool Type 3-4 for 75% of the time without stool softeners across 1 week  in order to decrease straining with bowel movements and perseve pelvic health   Time 12   Period Weeks   Status Achieved     PT LONG TERM GOAL #3   Title Pt will decrease her [place] score from 60% to < 30% in order to return to ADLs and QOL   Time 12   Period Weeks   Status On-going     PT LONG TERM GOAL #4   Title Pt will demo less than 1 fingers width abdominal separation and less cavitation below sternum in order to improve intraabdominal pressure for  optimal motility and less pelvic pain   Time 12   Period Weeks   Status Achieved     PT LONG TERM GOAL #5   Title pt will demo proper alignment/ modifications to fitness routine in order to minimize straining pelvic floor and abdominal muscles   Time 12   Period Weeks   Status On-going     Additional Long Term Goals   Additional Long Term Goals Yes     PT LONG TERM GOAL #6   Title Pt will demo no fascial / scar restrictions at perineum and will demo pelvic foor lengthening without stress response in order to return to ADLs   Time 12   Period Weeks   Status New               Plan - 06/06/17 2012    Clinical Impression Statement Pt has shown signficantly less perineal mm tensions internally but presents with increased tensions externally at the perineum near the anal fissure surgery scar. Added pelvic floor stretches into HEP. Pt continues to progress well towards her goals with  skilled PT.    Rehab Potential Good   PT Frequency 1x / week   PT Duration 12 weeks   PT Treatment/Interventions Moist Heat;Neuromuscular re-education;Therapeutic exercise;Therapeutic activities;Functional mobility training;Manual techniques;Scar mobilization;Aquatic Therapy;Stair training;Patient/family education   Consulted and Agree with Plan of Care Patient      Patient will benefit from skilled therapeutic intervention in order to improve the following deficits and impairments:  Decreased coordination, Decreased safety awareness, Postural dysfunction, Decreased range of motion, Improper body mechanics, Decreased mobility, Increased muscle spasms, Pain, Increased fascial restricitons, Decreased activity tolerance, Decreased endurance  Visit Diagnosis: Myalgia  Sacrococcygeal disorders, not elsewhere classified  Other symptoms and signs involving the musculoskeletal system     Problem List Patient Active Problem List   Diagnosis Date Noted  . Counseling for birth control, oral contraceptives 11/03/2016  . Acute upper respiratory infection 08/16/2016  . Anxiety state 01/24/2015  . Crohn's disease ([place]) 01/24/2015  . [place] INTESTINE 07/06/2010  . WEIGHT LOSS-ABNORMAL 07/06/2010  . DIARRHEA 07/06/2010  . NONSPEC ELEVATION OF LEVELS OF TRANSAMINASE/LDH 01/13/2009  . EXERCISE INDUCED ASTHMA 03/18/2008  . CROHN'S DISEASE 03/18/2008  . ANAL FISTULA 03/18/2008  . PSORIASIS 03/18/2008  . OSTEOPENIA 03/18/2008  . ANEMIA, HX OF 03/18/2008    Cynthia [name] ,PT, DPT, E-RYT  06/06/2017, 8:16 PM  [place] MAIN [facility] SERVICES [address] [place], [place], [address] Phone: [phone]   Fax:  [phone]  Name: Cynthia [name] MRN: [id number] Date of Birth: [birth year]

## 2017-06-12 ENCOUNTER — Ambulatory Visit: Payer: 59 | Admitting: Physical Therapy

## 2017-06-14 MED FILL — STELARA 90 MG/ML SOSY: 90 | 30 days supply | Qty: 1 | Fill #2

## 2017-06-26 ENCOUNTER — Ambulatory Visit: Payer: 59 | Admitting: Physical Therapy

## 2017-06-26 DIAGNOSIS — M533 Sacrococcygeal disorders, not elsewhere classified: Secondary | ICD-10-CM | POA: Diagnosis not present

## 2017-06-26 DIAGNOSIS — M791 Myalgia, unspecified site: Secondary | ICD-10-CM

## 2017-06-26 DIAGNOSIS — R29898 Other symptoms and signs involving the musculoskeletal system: Secondary | ICD-10-CM

## 2017-06-27 NOTE — Therapy (Signed)
[place] MAIN [facility] SERVICES [address] [place], [place], [address] Phone: [phone]   Fax:  [phone]  Physical Therapy Treatment  Patient Details  Name: Cynthia [name] MRN: [id number] Date of Birth: 1981-09-14 Referring Provider: Leafy Ro  Encounter Date: 06/26/2017      PT End of Session - 06/27/17 2228    Visit Number 5   Number of Visits 12   Date for PT Re-Evaluation 07/24/17   PT Start Time 9371   PT Stop Time 1400   PT Time Calculation (min) 52 min   Activity Tolerance Patient tolerated treatment well;No increased pain   Behavior During Therapy WFL for tasks assessed/performed      Past Medical History:  Diagnosis Date  . Crohn disease ([place])     No past surgical history on file.  There were no vitals filed for this visit.      Subjective Assessment - 06/27/17 2239    Subjective Pt reports no more constipation and 2/10 level  pelvic pain.    Pertinent History No pregnancies, abdominal/ spinal surgeries.   Hx of anxiety, managed with pyshcotherapy, running, reading, gently yoga,walking. Waiting to f/u with [name], NP, re: low grade bleeding since the birth control impant     Patient Stated Goals Figure out where the pain is coming from.  Not have to use the cream                       Pelvic Floor Special Questions - 06/27/17 0813    External Palpation tenderness/ tensions at perineum            [facility] Adult PT Treatment/Exercise - 06/27/17 2231      Neuro Re-ed    Neuro Re-ed Details  safe transition into and out of pigeon pose ( pose she had difficutly and pain with ) . provided modifications with yoga block      Manual Therapy   Manual therapy comments external, perineal scar, ischial tuberosity, MWM                      PT Long Term Goals - 06/26/17 1417      PT LONG TERM GOAL #1   Title Pt will decrease her COREFO score decrease from 24% to < 17% in order to improve  pelvic function (9/25: 16% )   Time 12   Period Weeks   Status Achieved     PT LONG TERM GOAL #2   Title Pt will report [place] Stool Type 3-4 for 75% of the time without stool softeners across 1 week  in order to decrease straining with bowel movements and perseve pelvic health   Time 12   Period Weeks   Status Achieved     PT LONG TERM GOAL #3   Title Pt will decrease her [place] score from 60% to < 30% in order to return to ADLs and QOL (9/25: 1%)    Time 12   Period Weeks   Status Achieved     PT LONG TERM GOAL #4   Title Pt will demo less than 1 fingers width abdominal separation and less cavitation below sternum in order to improve intraabdominal pressure for optimal motility and less pelvic pain   Time 12   Period Weeks   Status Achieved     PT LONG TERM GOAL #5   Title pt will demo proper alignment/ modifications to fitness routine in order  to minimize straining pelvic floor and abdominal muscles   Time 12   Period Weeks   Status On-going     PT LONG TERM GOAL #6   Title Pt will demo no fascial / scar restrictions at perineum and will demo pelvic foor lengthening without stress response in order to return to ADLs   Time 12   Period Weeks   Status Partially Met               Plan - 06/27/17 2240    Clinical Impression Statement Pt demo'd less perineal scar restrictions but will continue to require further manual Tx at future visits to decrease remaining restrictions. Addressed yoga pose that caused her pain and difficulty due to limited hip mobility (pigeon pose) by providing modifications and preliminary poses to increase hip mobility. Pt continues to benefit from skilled PT   Rehab Potential Good   PT Frequency 1x / week   PT Duration 12 weeks   PT Treatment/Interventions Moist Heat;Neuromuscular re-education;Therapeutic exercise;Therapeutic activities;Functional mobility training;Manual techniques;Scar mobilization;Aquatic Therapy;Stair training;Patient/family  education   Consulted and Agree with Plan of Care Patient      Patient will benefit from skilled therapeutic intervention in order to improve the following deficits and impairments:  Decreased coordination, Decreased safety awareness, Postural dysfunction, Decreased range of motion, Improper body mechanics, Decreased mobility, Increased muscle spasms, Pain, Increased fascial restricitons, Decreased activity tolerance, Decreased endurance  Visit Diagnosis: Myalgia  Sacrococcygeal disorders, not elsewhere classified  Other symptoms and signs involving the musculoskeletal system     Problem List Patient Active Problem List   Diagnosis Date Noted  . Counseling for birth control, oral contraceptives 11/03/2016  . Acute upper respiratory infection 08/16/2016  . Anxiety state 01/24/2015  . Crohn's disease ([facility]) 01/24/2015  . CROHN'S DISEASE-LARGE & SMALL INTESTINE 07/06/2010  . WEIGHT LOSS-ABNORMAL 07/06/2010  . DIARRHEA 07/06/2010  . NONSPEC ELEVATION OF LEVELS OF TRANSAMINASE/LDH 01/13/2009  . EXERCISE INDUCED ASTHMA 03/18/2008  . CROHN'S DISEASE 03/18/2008  . ANAL FISTULA 03/18/2008  . PSORIASIS 03/18/2008  . OSTEOPENIA 03/18/2008  . ANEMIA, HX OF 03/18/2008    ,Shin Yiing ,PT, DPT, E-RYT  06/27/2017, 10:45 PM  [place] [facility] REHAB SERVICES [address] [place], [place], [address] Phone: [phone]   Fax:  [phone]  Name: Cynthia [name] MRN: [id number] Date of Birth: 08/30/1981   

## 2017-07-03 DIAGNOSIS — F411 Generalized anxiety disorder: Secondary | ICD-10-CM | POA: Diagnosis not present

## 2017-07-09 DIAGNOSIS — L409 Psoriasis, unspecified: Secondary | ICD-10-CM | POA: Diagnosis not present

## 2017-07-09 DIAGNOSIS — K50119 Crohn's disease of large intestine with unspecified complications: Secondary | ICD-10-CM | POA: Diagnosis not present

## 2017-07-09 MED FILL — SERTRALINE HCL 50 MG TABLET: 50 | 30 days supply | Qty: 45 | Fill #1

## 2017-07-09 MED FILL — TRIAMCINOLONE 0.1% CREAM: 0.1 | 15 days supply | Qty: 80 | Fill #0

## 2017-07-09 MED FILL — FOLIC ACID 1 MG TABLET: 1 | 90 days supply | Qty: 90 | Fill #3

## 2017-07-10 ENCOUNTER — Ambulatory Visit: Payer: 59 | Admitting: Physical Therapy

## 2017-07-17 ENCOUNTER — Other Ambulatory Visit: Payer: Self-pay | Admitting: Obstetrics and Gynecology

## 2017-07-17 DIAGNOSIS — Z01419 Encounter for gynecological examination (general) (routine) without abnormal findings: Secondary | ICD-10-CM | POA: Diagnosis not present

## 2017-07-17 DIAGNOSIS — Z1231 Encounter for screening mammogram for malignant neoplasm of breast: Secondary | ICD-10-CM

## 2017-07-17 DIAGNOSIS — Z0000 Encounter for general adult medical examination without abnormal findings: Secondary | ICD-10-CM | POA: Diagnosis not present

## 2017-07-24 ENCOUNTER — Ambulatory Visit: Payer: 59 | Attending: Obstetrics and Gynecology | Admitting: Physical Therapy

## 2017-07-24 DIAGNOSIS — M533 Sacrococcygeal disorders, not elsewhere classified: Secondary | ICD-10-CM | POA: Diagnosis not present

## 2017-07-24 DIAGNOSIS — R29898 Other symptoms and signs involving the musculoskeletal system: Secondary | ICD-10-CM | POA: Insufficient documentation

## 2017-07-24 DIAGNOSIS — M791 Myalgia, unspecified site: Secondary | ICD-10-CM | POA: Diagnosis not present

## 2017-07-24 DIAGNOSIS — Z0000 Encounter for general adult medical examination without abnormal findings: Secondary | ICD-10-CM | POA: Diagnosis not present

## 2017-07-24 MED FILL — STELARA 90 MG/ML SOSY: 90 | 30 days supply | Qty: 1 | Fill #3

## 2017-07-24 NOTE — Therapy (Signed)
[place] MAIN [facility] SERVICES [address] [place], [place], [address] Phone: [phone]   Fax:  [phone]  Physical Therapy Treatment / Discharge Summary   Patient Details  Name: Cynthia [name] MRN: [id number] Date of Birth: [birth year] Referring Provider: Leafy Ro  Encounter Date: 07/24/2017      PT End of Session - 07/24/17 1312    Visit Number 6   Number of Visits 12   Date for PT Re-Evaluation 07/24/17   PT Start Time 6962   PT Stop Time 1410   PT Time Calculation (min) 58 min   Activity Tolerance Patient tolerated treatment well;No increased pain   Behavior During Therapy WFL for tasks assessed/performed      Past Medical History:  Diagnosis Date  . Crohn disease ([place])     No past surgical history on file.  There were no vitals filed for this visit.      Subjective Assessment - 07/24/17 1311    Subjective Pt reported she has had not had any pain for three months. Pt would like to learn more about how to stretch her obturator internus mm. Normally it is her left side that bothers her but this week is both sides    Pertinent History No pregnancies, abdominal/ spinal surgeries.   Hx of anxiety, managed with pyshcotherapy, running, reading, gently yoga,walking. Waiting to f/u with [name], NP, re: low grade bleeding since the birth control impant     Patient Stated Goals Figure out where the pain is coming from.  Not have to use the cream             [facility] PT Assessment - 07/24/17 1340      Functional Tests   Functional tests --  poor lumbopelvic dissassociation with PNF LE D1Flex     Palpation   SI assessment  R hip flex/IR /add with hypomobility at SIJ  and tenderness ( improved post Tx)                      [facility] Adult PT Treatment/Exercise - 07/24/17 1446      Neuro Re-ed    Neuro Re-ed Details  see pt instructions   fitness exercise, pilates ball hip abd/ER/ Ext      Manual Therapy   Internal Pelvic Floor long axis distraction RLE, STM at greater trochanter L , glut medius, Grade III mob AP at L hip FADDIR                       PT Long Term Goals - 07/24/17 1413      PT LONG TERM GOAL #1   Title Pt will decrease her COREFO score decrease from 24% to < 17% in order to improve pelvic function (9/25: 16% )   Time 12   Period Weeks   Status Achieved     PT LONG TERM GOAL #2   Title Pt will report [place] Stool Type 3-4 for 75% of the time without stool softeners across 1 week  in order to decrease straining with bowel movements and perseve pelvic health   Time 12   Period Weeks   Status Achieved     PT LONG TERM GOAL #3   Title Pt will decrease her Buck[place] score from 60% to < 30% in order to return to ADLs and QOL (9/25: 1%)    Time 12   Period Weeks   Status Achieved  PT LONG TERM GOAL #4   Title Pt will demo less than 1 fingers width abdominal separation and less cavitation below sternum in order to improve intraabdominal pressure for optimal motility and less pelvic pain   Time 12   Period Weeks   Status Achieved     PT LONG TERM GOAL #5   Title pt will demo proper alignment/ modifications to fitness routine in order to minimize straining pelvic floor and abdominal muscles   Time 12   Period Weeks   Status Achieved     PT LONG TERM GOAL #6   Title Pt will demo no fascial / scar restrictions at perineum and will demo pelvic foor lengthening without stress response in order to return to ADLs   Time 12   Period Weeks   Status Achieved               Plan - 07/24/17 1439    Clinical Impression Statement Pt has achieved 100% of her goals with return to her functional activities without pelvic pain. Pt's bowel function have also improved with improved stool consistency.  Pt's pelvic floor mm tensions have significantly improved along with perineal scar restrictions.  Pt's diastasis recti improved which has positively impacted her pelvic  floor function and motility. Pt has been trained on modifying her fitness routine and body mechanics at home and work to minimize relapse of Sx.  Based on GROC scale, p reports her Sx have improved "A Great Deal Better"  . Pt is ready to be d/c today.     Rehab Potential Good   PT Frequency 1x / week   PT Duration 12 weeks   PT Treatment/Interventions Moist Heat;Neuromuscular re-education;Therapeutic exercise;Therapeutic activities;Functional mobility training;Manual techniques;Scar mobilization;Aquatic Therapy;Stair training;Patient/family education   Consulted and Agree with Plan of Care Patient      Patient will benefit from skilled therapeutic intervention in order to improve the following deficits and impairments:  Decreased coordination, Decreased safety awareness, Postural dysfunction, Decreased range of motion, Improper body mechanics, Decreased mobility, Increased muscle spasms, Pain, Increased fascial restricitons, Decreased activity tolerance, Decreased endurance  Visit Diagnosis: Myalgia  Sacrococcygeal disorders, not elsewhere classified  Other symptoms and signs involving the musculoskeletal system     Problem List Patient Active Problem List   Diagnosis Date Noted  . Counseling for birth control, oral contraceptives 11/03/2016  . Acute upper respiratory infection 08/16/2016  . Anxiety state 01/24/2015  . Crohn's disease ([place]) 01/24/2015  . [place] INTESTINE 07/06/2010  . WEIGHT LOSS-ABNORMAL 07/06/2010  . DIARRHEA 07/06/2010  . NONSPEC ELEVATION OF LEVELS OF TRANSAMINASE/LDH 01/13/2009  . EXERCISE INDUCED ASTHMA 03/18/2008  . CROHN'S DISEASE 03/18/2008  . ANAL FISTULA 03/18/2008  . PSORIASIS 03/18/2008  . OSTEOPENIA 03/18/2008  . ANEMIA, HX OF 03/18/2008    [name] ,PT, DPT, E-RYT  07/24/2017, 2:50 PM  [place] MAIN [facility] SERVICES [address] [place], [place], [address] Phone:  [phone]   Fax:  [phone]  Name: Cynthia [name] MRN: [id number] Date of Birth: 1981-06-11

## 2017-07-24 NOTE — Patient Instructions (Addendum)
Bridging series w/ resistive band other side of doorknob:  Level 1:  Position:  Elbows bent, knees hip width apart, heels under knees   Stabilization points: shoulders, upper arms, back of head pressed into floor. Heel press downward.   Movement: inhale do nothing, exhale pull band by side, lower fists to floor completely while lifting hips.Keep stabilization points engaged when you allow the band to go back to starting position  10 x 2 reps       Level 2:  Position:  Elbows straight, arms raised to ceiling at shoulder height, knees apart like a ballerina,heels together, heels under knees, shinbone perpendicular to ground   Stabilization points: shoulders, upper arms, back of head pressed into floor. Heel press downward.   Movement: inhale do nothing, exhale pull band by side, lower fists to floor completely while lifting hips. Keep stabilization points engaged when you allow the band to go back to starting position   10 x 2 reps  Shoulder training: Try to imagine you are squeezing a pencil under your armpit and your shoulder blades are down away from your ears and towards each other     ______  obturator stretches:  Figure-4 with toe tap back diagonally ( for standing obturator stretch)  Without moving pelvis   10 reps    Cross thigh over thigh with towel pull  5 reps  ______   Standing at work station with semi tandem stance with equal weight bearing and grounding down  Instead of propping leg up

## 2017-08-02 DIAGNOSIS — D899 Disorder involving the immune mechanism, unspecified: Secondary | ICD-10-CM | POA: Diagnosis not present

## 2017-08-02 DIAGNOSIS — K6389 Other specified diseases of intestine: Secondary | ICD-10-CM | POA: Diagnosis not present

## 2017-08-02 DIAGNOSIS — K5010 Crohn's disease of large intestine without complications: Secondary | ICD-10-CM | POA: Diagnosis not present

## 2017-08-02 MED FILL — CIPROFLOXACIN HCL 500 MG TA: 500 | 30 days supply | Qty: 60 | Fill #0

## 2017-08-07 ENCOUNTER — Ambulatory Visit: Payer: 59 | Admitting: Physical Therapy

## 2017-08-21 ENCOUNTER — Ambulatory Visit
Admission: RE | Admit: 2017-08-21 | Discharge: 2017-08-21 | Disposition: A | Discharge status: Home / Self Care | Payer: 59 | Source: Ambulatory Visit | Attending: Obstetrics and Gynecology | Admitting: Obstetrics and Gynecology

## 2017-08-21 DIAGNOSIS — F411 Generalized anxiety disorder: Secondary | ICD-10-CM | POA: Diagnosis not present

## 2017-08-21 DIAGNOSIS — Z1231 Encounter for screening mammogram for malignant neoplasm of breast: Secondary | ICD-10-CM | POA: Diagnosis not present

## 2017-08-21 MED FILL — SERTRALINE HCL 50 MG TABLET: 50 | 30 days supply | Qty: 45 | Fill #2

## 2017-09-11 DIAGNOSIS — H524 Presbyopia: Secondary | ICD-10-CM | POA: Diagnosis not present

## 2017-09-12 MED FILL — STELARA 90 MG/ML SOSY: 90 | 30 days supply | Qty: 1 | Fill #4

## 2017-09-18 DIAGNOSIS — F411 Generalized anxiety disorder: Secondary | ICD-10-CM | POA: Diagnosis not present

## 2017-10-02 DIAGNOSIS — F411 Generalized anxiety disorder: Secondary | ICD-10-CM | POA: Diagnosis not present

## 2017-10-08 MED FILL — SERTRALINE HCL 50 MG TABLET: 50 | 30 days supply | Qty: 45 | Fill #3

## 2017-10-26 DIAGNOSIS — G43909 Migraine, unspecified, not intractable, without status migrainosus: Secondary | ICD-10-CM | POA: Diagnosis not present

## 2017-11-06 DIAGNOSIS — F411 Generalized anxiety disorder: Secondary | ICD-10-CM | POA: Diagnosis not present

## 2017-11-08 MED FILL — STELARA 90 MG/ML SOSY: 90 | 42 days supply | Qty: 1 | Fill #5

## 2017-11-14 ENCOUNTER — Ambulatory Visit
Admission: EM | Admit: 2017-11-14 | Discharge: 2017-11-14 | Disposition: A | Discharge status: Home / Self Care | Payer: 59 | Attending: Family Medicine | Admitting: Family Medicine

## 2017-11-14 ENCOUNTER — Other Ambulatory Visit: Payer: Self-pay

## 2017-11-14 ENCOUNTER — Encounter: Payer: Self-pay | Admitting: Emergency Medicine

## 2017-11-14 DIAGNOSIS — R05 Cough: Secondary | ICD-10-CM | POA: Diagnosis not present

## 2017-11-14 DIAGNOSIS — G933 Postviral fatigue syndrome: Secondary | ICD-10-CM | POA: Diagnosis not present

## 2017-11-14 DIAGNOSIS — R51 Headache: Secondary | ICD-10-CM | POA: Diagnosis not present

## 2017-11-14 DIAGNOSIS — G9331 Postviral fatigue syndrome: Secondary | ICD-10-CM

## 2017-11-14 DIAGNOSIS — J029 Acute pharyngitis, unspecified: Secondary | ICD-10-CM | POA: Diagnosis not present

## 2017-11-14 HISTORY — DX: Anxiety disorder, unspecified: F41.9

## 2017-11-14 LAB — RAPID STREP SCREEN (MED CTR MEBANE ONLY): STREPTOCOCCUS, GROUP A SCREEN (DIRECT): NEGATIVE

## 2017-11-14 MED ORDER — HYDROCOD POLST-CPM POLST ER 10-8 MG/5ML PO SUER: 5.0000 mL | Freq: Two times a day (BID) | ORAL | 0 refills | Status: DC | PRN

## 2017-11-14 NOTE — ED Provider Notes (Signed)
[facility]    CSN: [id number] Arrival date & time: 11/14/17  7829  History   Chief Complaint Chief Complaint  Patient presents with  . Generalized Body Aches   HPI  37 year old female presents with fever, chills, headaches, body aches, cough, sore throat.  Patient states that she abruptly developed fever, chills, headache on Friday.  She thinks that she has had influenza.  Since then she has had persistent fever and chills.  Has now developed cough and sore throat.  She has been out of work since Monday.  No known exacerbating or relieving factors.  She is taken Tylenol without significant improvement.  Symptoms are severe.  No other associated symptoms.  No other complaints at this time.  Past Medical History:  Diagnosis Date  . Anxiety   . Crohn disease [facility])    Patient Active Problem List   Diagnosis Date Noted  . Counseling for birth control, oral contraceptives 11/03/2016  . Acute upper respiratory infection 08/16/2016  . Anxiety state 01/24/2015  . Crohn's disease ([place]) 01/24/2015  . [place] INTESTINE 07/06/2010  . WEIGHT LOSS-ABNORMAL 07/06/2010  . DIARRHEA 07/06/2010  . NONSPEC ELEVATION OF LEVELS OF TRANSAMINASE/LDH 01/13/2009  . EXERCISE INDUCED ASTHMA 03/18/2008  . CROHN'S DISEASE 03/18/2008  . ANAL FISTULA 03/18/2008  . PSORIASIS 03/18/2008  . OSTEOPENIA 03/18/2008  . ANEMIA, HX OF 03/18/2008    History reviewed. No pertinent surgical history.  OB History    Gravida Para Term Preterm AB Living   0 0 0 0 0 0   SAB TAB Ectopic Multiple Live Births   0 0 0 0         Home Medications    Prior to Admission medications   Medication Sig Start Date End Date Taking? Authorizing Provider  cholecalciferol (VITAMIN D) 1000 units tablet Take 1,000 Units by mouth daily.   Yes [provider]  etonogestrel (NEXPLANON) 68 MG IMPL implant 1 each by Subdermal route once.   Yes [provider]  Prenatal  Multivit-Min-Fe-FA (PRENATAL VITAMINS PO) Take 1 tablet by mouth daily.   Yes [provider]  sertraline (ZOLOFT) 50 MG tablet Take 1.5 tablets (75 mg total) by mouth daily. 05/23/17  Yes Steve Rattler, DO  chlorpheniramine-HYDROcodone (TUSSIONEX PENNKINETIC ER) 10-8 MG/5ML SUER Take 5 mLs by mouth every 12 (twelve) hours as needed. 11/14/17   Coral Spikes, DO  EPINEPHrine (EPIPEN 2-PAK) 0.3 mg/0.3 mL IJ SOAJ injection Inject 0.3 mLs (0.3 mg total) into the muscle once. 12/13/15   Haney, Amedeo Plenty, MD  ustekinumab (STELARA) 90 MG/ML SOSY injection Inject 1 mL (90 mg total) into the skin every 6 (six) weeks. 03/20/17   Tresa Garter, MD    Family History Family History  Problem Relation Age of Onset  . Diabetes Unknown        grandparent  . Skin cancer Unknown        grandfather  . Stroke Unknown        grandmother  . Healthy Mother   . Healthy Father   . Breast cancer Neg Hx     Social History Social History   Tobacco Use  . Smoking status: Never Smoker  . Smokeless tobacco: Never Used  Substance Use Topics  . Alcohol use: Yes    Alcohol/week: 0.6 oz    Types: 1 Glasses of wine per week  . Drug use: No     Allergies   Almond meal and Soy allergy  Review of Systems Review of Systems  Constitutional: Positive for chills and fever.  HENT: Positive for sore throat.   Respiratory: Positive for cough.   Musculoskeletal:       Body aches.  Neurological: Positive for headaches.   Physical Exam Triage Vital Signs ED Triage Vitals [11/14/17 0938]  Enc Vitals Group     BP 110/68     Pulse Rate 91     Resp 16     Temp 98.5 F (36.9 C)     Temp Source Oral     SpO2 100 %     Weight 120 lb (54.4 kg)     Height 5\' 5"  (1.651 m)     Head Circumference      Peak Flow      Pain Score 4     Pain Loc      Pain Edu?      Excl. in [place]?    Updated Vital Signs BP 110/68 (BP Location: Left Arm)   Pulse 91   Temp 98.5 F (36.9 C) (Oral)   Resp 16   Ht 5'  5" (1.651 m)   Wt 120 lb (54.4 kg)   LMP 11/12/2017   SpO2 100%   BMI 19.97 kg/m   Physical Exam  Constitutional: She is oriented to person, place, and time. She appears well-developed. No distress.  HENT:  Head: Normocephalic and atraumatic.  Oropharynx with mild erythema.  No exudate. Normal TM's.  Eyes: Conjunctivae are normal. Right eye exhibits no discharge. Left eye exhibits no discharge.  Cardiovascular: Normal rate and regular rhythm.  Pulmonary/Chest: Effort normal and breath sounds normal. She has no wheezes. She has no rales.  Neurological: She is alert and oriented to person, place, and time.  Psychiatric: She has a normal mood and affect. Her behavior is normal.  Nursing note and vitals reviewed.  UC Treatments / Results  Labs (all labs ordered are listed, but only abnormal results are displayed) Labs Reviewed  RAPID STREP SCREEN (NOT AT [facility])  CULTURE, GROUP A STREP [facility])    EKG  EKG Interpretation None       Radiology No results found.  Procedures Procedures (including critical care time)  Medications Ordered in UC Medications - No data to display   Initial Impression / Assessment and Plan / UC Course  I have reviewed the triage vital signs and the nursing notes.  Pertinent labs & imaging results that were available during my care of the patient were reviewed by me and considered in my medical decision making (see chart for details).     37 year old female presents with what appears to be post influenza symptoms.  Out of work.  Tussionex for cough.  Supportive care.  Final Clinical Impressions(s) / UC Diagnoses   Final diagnoses:  Post-influenza syndrome    ED Discharge Orders        Ordered    chlorpheniramine-HYDROcodone (TUSSIONEX PENNKINETIC ER) 10-8 MG/5ML SUER  Every 12 hours PRN     11/14/17 1037     Controlled Substance Prescriptions [place] Controlled Substance Registry consulted? Not Applicable   Coral Spikes, DO 11/14/17  1042

## 2017-11-14 NOTE — Discharge Instructions (Signed)
Rest, fluids, tylenol, motrin.  Tussionex for cough.  Take care  Dr. Lacinda Axon

## 2017-11-14 NOTE — ED Triage Notes (Addendum)
Patent in today c/o 5 day history of body aches, fever, headache, productive cough (yellow) and sore throat. Last dose of tylenol was 3am this morning.

## 2017-11-17 LAB — CULTURE, GROUP A STREP (THRC)

## 2017-11-20 DIAGNOSIS — F411 Generalized anxiety disorder: Secondary | ICD-10-CM | POA: Diagnosis not present

## 2017-11-22 MED FILL — SERTRALINE HCL 50 MG TABLET: 50 | 30 days supply | Qty: 45 | Fill #4

## 2017-11-27 DIAGNOSIS — K5010 Crohn's disease of large intestine without complications: Secondary | ICD-10-CM | POA: Diagnosis not present

## 2017-11-27 DIAGNOSIS — Z3046 Encounter for surveillance of implantable subdermal contraceptive: Secondary | ICD-10-CM | POA: Diagnosis not present

## 2017-11-27 DIAGNOSIS — R05 Cough: Secondary | ICD-10-CM | POA: Diagnosis not present

## 2017-12-04 DIAGNOSIS — F411 Generalized anxiety disorder: Secondary | ICD-10-CM | POA: Diagnosis not present

## 2017-12-06 ENCOUNTER — Ambulatory Visit: Payer: 59

## 2017-12-11 DIAGNOSIS — F411 Generalized anxiety disorder: Secondary | ICD-10-CM | POA: Diagnosis not present

## 2017-12-18 DIAGNOSIS — K50919 Crohn's disease, unspecified, with unspecified complications: Secondary | ICD-10-CM | POA: Diagnosis not present

## 2017-12-18 DIAGNOSIS — F411 Generalized anxiety disorder: Secondary | ICD-10-CM | POA: Diagnosis not present

## 2017-12-18 DIAGNOSIS — Z8659 Personal history of other mental and behavioral disorders: Secondary | ICD-10-CM | POA: Diagnosis not present

## 2017-12-25 DIAGNOSIS — F411 Generalized anxiety disorder: Secondary | ICD-10-CM | POA: Diagnosis not present

## 2018-01-01 DIAGNOSIS — F411 Generalized anxiety disorder: Secondary | ICD-10-CM | POA: Diagnosis not present

## 2018-01-02 MED FILL — SERTRALINE HCL 50 MG TABLET: 50 | 30 days supply | Qty: 45 | Fill #5

## 2018-01-02 MED FILL — STELARA 90 MG/ML SOSY: 90 | 42 days supply | Qty: 1 | Fill #6

## 2018-01-15 DIAGNOSIS — F411 Generalized anxiety disorder: Secondary | ICD-10-CM | POA: Diagnosis not present

## 2018-01-29 DIAGNOSIS — F411 Generalized anxiety disorder: Secondary | ICD-10-CM | POA: Diagnosis not present

## 2018-01-31 DIAGNOSIS — F411 Generalized anxiety disorder: Secondary | ICD-10-CM | POA: Diagnosis not present

## 2018-02-05 ENCOUNTER — Other Ambulatory Visit: Payer: Self-pay

## 2018-02-05 ENCOUNTER — Ambulatory Visit
Admission: EM | Admit: 2018-02-05 | Discharge: 2018-02-05 | Disposition: A | Discharge status: Home / Self Care | Payer: 59 | Attending: Family Medicine | Admitting: Family Medicine

## 2018-02-05 DIAGNOSIS — F411 Generalized anxiety disorder: Secondary | ICD-10-CM | POA: Diagnosis not present

## 2018-02-05 DIAGNOSIS — H1013 Acute atopic conjunctivitis, bilateral: Secondary | ICD-10-CM | POA: Diagnosis not present

## 2018-02-05 NOTE — ED Triage Notes (Signed)
Patient complains of bilateral eye redness, drainage, and swelling x 2 weeks with worsening symptoms on Sunday. Patient states that originated in left eye and spread to right eye this morning.

## 2018-02-05 NOTE — Discharge Instructions (Signed)
Naphcon-A eye drops, Allegra

## 2018-02-05 NOTE — ED Provider Notes (Signed)
[facility]    CSN: [id number] Arrival date & time: 02/05/18  1002     History   Chief Complaint Chief Complaint  Patient presents with  . 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   The history is provided by the patient.  Eye Problem  Location:  Both eyes Quality: redness, tearing. Severity:  Moderate Onset quality:  Sudden Duration:  2 weeks Timing:  Constant Progression:  Waxing and waning Chronicity:  New Context: not burn, not chemical exposure, not contact lens problem, not direct trauma, not foreign body, not using machinery, not scratch, not smoke exposure and not UV exposure   Relieved by:  Nothing Ineffective treatments:  Eye drops Associated symptoms: crusting, redness and tearing   Associated symptoms: no blurred vision, no decreased vision, no discharge, no double vision, no facial rash, no headaches, no inflammation, no itching, no nausea, no numbness, no photophobia, no scotomas, no swelling, no tingling, no vomiting and no weakness     Past Medical History:  Diagnosis Date  . Anxiety   . Crohn disease [facility])     Patient Active Problem List   Diagnosis Date Noted  . Counseling for birth control, oral contraceptives 11/03/2016  . Acute upper respiratory infection 08/16/2016  . Anxiety state 01/24/2015  . Crohn's disease ([place]) 01/24/2015  . [place] INTESTINE 07/06/2010  . WEIGHT LOSS-ABNORMAL 07/06/2010  . DIARRHEA 07/06/2010  . NONSPEC ELEVATION OF LEVELS OF TRANSAMINASE/LDH 01/13/2009  . EXERCISE INDUCED ASTHMA 03/18/2008  . CROHN'S DISEASE 03/18/2008  . ANAL FISTULA 03/18/2008  . PSORIASIS 03/18/2008  . OSTEOPENIA 03/18/2008  . ANEMIA, HX OF 03/18/2008    Past Surgical History:  Procedure Laterality Date  . NO PAST SURGERIES      OB History    Gravida  0   Para  0   Term  0   Preterm  0   AB  0   Living  0     SAB  0   TAB  0   Ectopic  0   Multiple  0   Live Births              Home Medications    Prior to Admission medications   Medication Sig Start Date End Date Taking? Authorizing Provider  cholecalciferol (VITAMIN D) 1000 units tablet Take 1,000 Units by mouth daily.   Yes [provider]  EPINEPHrine (EPIPEN 2-PAK) 0.3 mg/0.3 mL IJ SOAJ injection Inject 0.3 mLs (0.3 mg total) into the muscle once. 12/13/15  Yes Haney, Alyssa A, MD  Prenatal Multivit-Min-Fe-FA (PRENATAL VITAMINS PO) Take 1 tablet by mouth daily.   Yes [provider]  sertraline (ZOLOFT) 50 MG tablet Take 1.5 tablets (75 mg total) by mouth daily. 05/23/17  Yes Riccio, Angela C, DO  ustekinumab (STELARA) 90 MG/ML SOSY injection Inject 1 mL (90 mg total) into the skin every 6 (six) weeks. 03/20/17  Yes Tresa Garter, MD  chlorpheniramine-HYDROcodone (TUSSIONEX PENNKINETIC ER) 10-8 MG/5ML SUER Take 5 mLs by mouth every 12 (twelve) hours as needed. 11/14/17   Coral Spikes, DO  etonogestrel (NEXPLANON) 68 MG IMPL implant 1 each by Subdermal route once.    [provider]    Family History Family History  Problem Relation Age of Onset  . Diabetes Unknown        grandparent  . Skin cancer Unknown        grandfather  . Stroke  Unknown        grandmother  . Healthy Mother   . Healthy Father   . Breast cancer Neg Hx     Social History Social History   Tobacco Use  . Smoking status: Never Smoker  . Smokeless tobacco: Never Used  Substance Use Topics  . Alcohol use: Yes    Alcohol/week: 0.6 oz    Types: 1 Glasses of wine per week    Comment: occasional  . Drug use: No     Allergies   Almond meal and Soy allergy   Review of Systems Review of Systems  Eyes: Positive for redness. Negative for blurred vision, double vision, photophobia, discharge and itching.  Gastrointestinal: Negative for nausea and vomiting.  Neurological: Negative for tingling, weakness, numbness and headaches.     Physical Exam Triage Vital Signs ED Triage  Vitals  Enc Vitals Group     BP 02/05/18 1050 106/70     Pulse Rate 02/05/18 1050 (!) 56     Resp 02/05/18 1050 17     Temp 02/05/18 1050 98.3 F (36.8 C)     Temp Source 02/05/18 1050 Oral     SpO2 --      Weight 02/05/18 1048 120 lb (54.4 kg)     Height 02/05/18 1048 5\' 5"  (1.651 m)     Head Circumference --      Peak Flow --      Pain Score 02/05/18 1048 1     Pain Loc --      Pain Edu? --      Excl. in [place]? --    No data found.  Updated Vital Signs BP 106/70 (BP Location: Right Arm)   Pulse (!) 56   Temp 98.3 F (36.8 C) (Oral)   Resp 17   Ht 5\' 5"  (1.651 m)   Wt 120 lb (54.4 kg)   LMP 01/03/2018   BMI 19.97 kg/m   Visual Acuity Right Eye Distance: 20/20(corrected) Left Eye Distance: 20/15(corrected) Bilateral Distance: 20/15 (corrected)  Right Eye Near:   Left Eye Near:    Bilateral Near:     Physical Exam  Constitutional: She appears well-developed and well-nourished. No distress.  Eyes: Pupils are equal, round, and reactive to light. EOM are normal. Right eye exhibits no discharge. Left eye exhibits no discharge. Right conjunctiva is injected (mild). Left conjunctiva is injected (mild).  Skin: She is not diaphoretic.  Nursing note and vitals reviewed.    UC Treatments / Results  Labs (all labs ordered are listed, but only abnormal results are displayed) Labs Reviewed - No data to display  EKG None  Radiology No results found.  Procedures Procedures (including critical care time)  Medications Ordered in UC Medications - No data to display  Initial Impression / Assessment and Plan / UC Course  I have reviewed the triage vital signs and the nursing notes.  Pertinent labs & imaging results that were available during my care of the patient were reviewed by me and considered in my medical decision making (see chart for details).      Final Clinical Impressions(s) / UC Diagnoses   Final diagnoses:  Allergic conjunctivitis of both eyes      Discharge Instructions     Naphcon-A eye drops, Allegra    ED Prescriptions    None      1. diagnosis reviewed with patient 2. Recommend supportive treatment with above d/c instructions 3. Follow-up prn if symptoms worsen or don't improve  Controlled Substance Prescriptions [place] Controlled Substance Registry consulted? Not Applicable   Norval Gable, MD 02/05/18 1436

## 2018-02-12 DIAGNOSIS — F411 Generalized anxiety disorder: Secondary | ICD-10-CM | POA: Diagnosis not present

## 2018-02-12 DIAGNOSIS — K5010 Crohn's disease of large intestine without complications: Secondary | ICD-10-CM | POA: Diagnosis not present

## 2018-02-15 MED FILL — STELARA 90 MG/ML SOSY: 90 | 42 days supply | Qty: 1 | Fill #7

## 2018-02-20 MED FILL — SERTRALINE HCL 50 MG TABLET: 50 | 90 days supply | Qty: 90 | Fill #0

## 2018-03-05 DIAGNOSIS — F411 Generalized anxiety disorder: Secondary | ICD-10-CM | POA: Diagnosis not present

## 2018-03-12 DIAGNOSIS — F411 Generalized anxiety disorder: Secondary | ICD-10-CM | POA: Diagnosis not present

## 2018-03-26 ENCOUNTER — Other Ambulatory Visit: Payer: Self-pay | Admitting: Internal Medicine

## 2018-03-26 DIAGNOSIS — F411 Generalized anxiety disorder: Secondary | ICD-10-CM | POA: Diagnosis not present

## 2018-03-26 MED ORDER — USTEKINUMAB 90 MG/ML ~~LOC~~ SOSY: 90.0000 mg | PREFILLED_SYRINGE | SUBCUTANEOUS | 8 refills | Status: DC

## 2018-03-28 DIAGNOSIS — B379 Candidiasis, unspecified: Secondary | ICD-10-CM | POA: Diagnosis not present

## 2018-03-28 DIAGNOSIS — Z79899 Other long term (current) drug therapy: Secondary | ICD-10-CM | POA: Diagnosis not present

## 2018-03-28 DIAGNOSIS — Z9889 Other specified postprocedural states: Secondary | ICD-10-CM | POA: Diagnosis not present

## 2018-03-28 DIAGNOSIS — L309 Dermatitis, unspecified: Secondary | ICD-10-CM | POA: Diagnosis not present

## 2018-03-28 DIAGNOSIS — K5090 Crohn's disease, unspecified, without complications: Secondary | ICD-10-CM | POA: Diagnosis not present

## 2018-03-28 DIAGNOSIS — R197 Diarrhea, unspecified: Secondary | ICD-10-CM | POA: Diagnosis not present

## 2018-03-28 DIAGNOSIS — K5010 Crohn's disease of large intestine without complications: Secondary | ICD-10-CM | POA: Diagnosis not present

## 2018-03-28 DIAGNOSIS — L409 Psoriasis, unspecified: Secondary | ICD-10-CM | POA: Diagnosis not present

## 2018-03-29 MED FILL — STELARA 90 MG/ML SOSY: 90 | 42 days supply | Qty: 1 | Fill #0

## 2018-04-09 DIAGNOSIS — F411 Generalized anxiety disorder: Secondary | ICD-10-CM | POA: Diagnosis not present

## 2018-04-23 DIAGNOSIS — F411 Generalized anxiety disorder: Secondary | ICD-10-CM | POA: Diagnosis not present

## 2018-05-07 DIAGNOSIS — F411 Generalized anxiety disorder: Secondary | ICD-10-CM | POA: Diagnosis not present

## 2018-05-13 MED FILL — STELARA 90 MG/ML SOSY: 90 | 42 days supply | Qty: 1 | Fill #1

## 2018-05-14 DIAGNOSIS — F411 Generalized anxiety disorder: Secondary | ICD-10-CM | POA: Diagnosis not present

## 2018-05-21 MED FILL — SERTRALINE HCL 50 MG TABLET: 50 | 90 days supply | Qty: 90 | Fill #1

## 2018-05-27 DIAGNOSIS — N926 Irregular menstruation, unspecified: Secondary | ICD-10-CM | POA: Diagnosis not present

## 2018-05-27 DIAGNOSIS — R102 Pelvic and perineal pain: Secondary | ICD-10-CM | POA: Diagnosis not present

## 2018-05-28 DIAGNOSIS — F411 Generalized anxiety disorder: Secondary | ICD-10-CM | POA: Diagnosis not present

## 2018-05-29 DIAGNOSIS — N926 Irregular menstruation, unspecified: Secondary | ICD-10-CM | POA: Diagnosis not present

## 2018-06-04 DIAGNOSIS — F411 Generalized anxiety disorder: Secondary | ICD-10-CM | POA: Diagnosis not present

## 2018-06-11 DIAGNOSIS — F411 Generalized anxiety disorder: Secondary | ICD-10-CM | POA: Diagnosis not present

## 2018-06-12 DIAGNOSIS — Z3149 Encounter for other procreative investigation and testing: Secondary | ICD-10-CM | POA: Diagnosis not present

## 2018-06-12 DIAGNOSIS — N926 Irregular menstruation, unspecified: Secondary | ICD-10-CM | POA: Diagnosis not present

## 2018-06-17 DIAGNOSIS — Z1349 Encounter for screening for other developmental delays: Secondary | ICD-10-CM | POA: Diagnosis not present

## 2018-06-18 DIAGNOSIS — F411 Generalized anxiety disorder: Secondary | ICD-10-CM | POA: Diagnosis not present

## 2018-06-25 DIAGNOSIS — F411 Generalized anxiety disorder: Secondary | ICD-10-CM | POA: Diagnosis not present

## 2018-06-25 MED FILL — STELARA 90 MG/ML SOSY: 90 | 42 days supply | Qty: 1 | Fill #2

## 2018-07-02 DIAGNOSIS — F411 Generalized anxiety disorder: Secondary | ICD-10-CM | POA: Diagnosis not present

## 2018-07-09 DIAGNOSIS — F411 Generalized anxiety disorder: Secondary | ICD-10-CM | POA: Diagnosis not present

## 2018-07-16 DIAGNOSIS — F411 Generalized anxiety disorder: Secondary | ICD-10-CM | POA: Diagnosis not present

## 2018-07-23 DIAGNOSIS — Z01419 Encounter for gynecological examination (general) (routine) without abnormal findings: Secondary | ICD-10-CM | POA: Diagnosis not present

## 2018-07-24 DIAGNOSIS — R300 Dysuria: Secondary | ICD-10-CM | POA: Diagnosis not present

## 2018-07-30 DIAGNOSIS — F411 Generalized anxiety disorder: Secondary | ICD-10-CM | POA: Diagnosis not present

## 2018-08-06 DIAGNOSIS — Z0183 Encounter for blood typing: Secondary | ICD-10-CM | POA: Diagnosis not present

## 2018-08-06 DIAGNOSIS — N926 Irregular menstruation, unspecified: Secondary | ICD-10-CM | POA: Diagnosis not present

## 2018-08-06 DIAGNOSIS — E2839 Other primary ovarian failure: Secondary | ICD-10-CM | POA: Diagnosis not present

## 2018-08-06 DIAGNOSIS — Z789 Other specified health status: Secondary | ICD-10-CM | POA: Diagnosis not present

## 2018-08-06 DIAGNOSIS — D8989 Other specified disorders involving the immune mechanism, not elsewhere classified: Secondary | ICD-10-CM | POA: Diagnosis not present

## 2018-08-06 DIAGNOSIS — N970 Female infertility associated with anovulation: Secondary | ICD-10-CM | POA: Diagnosis not present

## 2018-08-12 MED FILL — STELARA 90 MG/ML SOSY: 90 | 42 days supply | Qty: 1 | Fill #3

## 2018-08-13 DIAGNOSIS — F411 Generalized anxiety disorder: Secondary | ICD-10-CM | POA: Diagnosis not present

## 2018-08-19 DIAGNOSIS — N926 Irregular menstruation, unspecified: Secondary | ICD-10-CM | POA: Diagnosis not present

## 2018-08-23 MED FILL — SERTRALINE HCL 50 MG TABLET: 50 | 90 days supply | Qty: 90 | Fill #2

## 2018-08-27 DIAGNOSIS — N926 Irregular menstruation, unspecified: Secondary | ICD-10-CM | POA: Diagnosis not present

## 2018-08-27 DIAGNOSIS — F411 Generalized anxiety disorder: Secondary | ICD-10-CM | POA: Diagnosis not present

## 2018-09-02 DIAGNOSIS — N970 Female infertility associated with anovulation: Secondary | ICD-10-CM | POA: Diagnosis not present

## 2018-09-02 DIAGNOSIS — E2839 Other primary ovarian failure: Secondary | ICD-10-CM | POA: Diagnosis not present

## 2018-09-03 DIAGNOSIS — F411 Generalized anxiety disorder: Secondary | ICD-10-CM | POA: Diagnosis not present

## 2018-09-03 DIAGNOSIS — K5010 Crohn's disease of large intestine without complications: Secondary | ICD-10-CM | POA: Diagnosis not present

## 2018-09-05 ENCOUNTER — Encounter (HOSPITAL_COMMUNITY): Payer: Self-pay | Admitting: Emergency Medicine

## 2018-09-05 ENCOUNTER — Emergency Department (HOSPITAL_COMMUNITY): Payer: 59

## 2018-09-05 ENCOUNTER — Emergency Department (HOSPITAL_COMMUNITY)
Admission: EM | Admit: 2018-09-05 | Discharge: 2018-09-05 | Disposition: A | Discharge status: Home / Self Care | Payer: 59 | Attending: Emergency Medicine | Admitting: Emergency Medicine

## 2018-09-05 DIAGNOSIS — Z79899 Other long term (current) drug therapy: Secondary | ICD-10-CM | POA: Insufficient documentation

## 2018-09-05 DIAGNOSIS — G43109 Migraine with aura, not intractable, without status migrainosus: Secondary | ICD-10-CM | POA: Insufficient documentation

## 2018-09-05 DIAGNOSIS — H539 Unspecified visual disturbance: Secondary | ICD-10-CM | POA: Diagnosis not present

## 2018-09-05 DIAGNOSIS — H53141 Visual discomfort, right eye: Secondary | ICD-10-CM | POA: Diagnosis not present

## 2018-09-05 DIAGNOSIS — H538 Other visual disturbances: Secondary | ICD-10-CM | POA: Diagnosis not present

## 2018-09-05 LAB — CBC WITH DIFFERENTIAL/PLATELET
Abs Immature Granulocytes: 0.01 10*3/uL (ref 0.00–0.07)
BASOS ABS: 0.1 10*3/uL (ref 0.0–0.1)
Basophils Relative: 1 %
EOS ABS: 0.5 10*3/uL (ref 0.0–0.5)
EOS PCT: 7 %
HCT: 43.6 % (ref 36.0–46.0)
HEMOGLOBIN: 13.4 g/dL (ref 12.0–15.0)
Immature Granulocytes: 0 %
LYMPHS ABS: 3.4 10*3/uL (ref 0.7–4.0)
LYMPHS PCT: 42 %
MCH: 26.7 pg (ref 26.0–34.0)
MCHC: 30.7 g/dL (ref 30.0–36.0)
MCV: 87 fL (ref 80.0–100.0)
MONO ABS: 0.6 10*3/uL (ref 0.1–1.0)
Monocytes Relative: 7 %
NRBC: 0 % (ref 0.0–0.2)
Neutro Abs: 3.5 10*3/uL (ref 1.7–7.7)
Neutrophils Relative %: 43 %
Platelets: 289 10*3/uL (ref 150–400)
RBC: 5.01 MIL/uL (ref 3.87–5.11)
RDW: 13.6 % (ref 11.5–15.5)
WBC: 8.2 10*3/uL (ref 4.0–10.5)

## 2018-09-05 LAB — BASIC METABOLIC PANEL
ANION GAP: 7 (ref 5–15)
BUN: 7 mg/dL (ref 6–20)
CALCIUM: 9.3 mg/dL (ref 8.9–10.3)
CO2: 29 mmol/L (ref 22–32)
CREATININE: 0.83 mg/dL (ref 0.44–1.00)
Chloride: 101 mmol/L (ref 98–111)
GFR calc Af Amer: >60 mL/min (ref >60)
GFR calc non Af Amer: >60 mL/min (ref >60)
Glucose, Bld: 97 mg/dL (ref 70–99)
Potassium: 3.6 mmol/L (ref 3.5–5.1)
Sodium: 137 mmol/L (ref 135–145)

## 2018-09-05 LAB — I-STAT BETA HCG BLOOD, ED (MC, WL, AP ONLY): I-stat hCG, quantitative: <5 m[IU]/mL (ref <5)

## 2018-09-05 MED ORDER — IOPAMIDOL (ISOVUE-370) INJECTION 76%
100.0000 mL | Freq: Once | INTRAVENOUS | Status: AC | PRN
  Administered 2018-09-05: 75 mL via INTRAVENOUS

## 2018-09-05 MED ORDER — DIPHENHYDRAMINE HCL 50 MG/ML IJ SOLN
12.5000 mg | Freq: Once | INTRAMUSCULAR | Status: AC
  Administered 2018-09-05: 12.5 mg via INTRAVENOUS
  Filled 2018-09-05: qty 1

## 2018-09-05 MED ORDER — IOPAMIDOL (ISOVUE-370) INJECTION 76%
INTRAVENOUS | Status: AC
  Filled 2018-09-05: qty 100

## 2018-09-05 MED ORDER — GADOBUTROL 1 MMOL/ML IV SOLN
5.0000 mL | Freq: Once | INTRAVENOUS | Status: AC | PRN
  Administered 2018-09-05: 5 mL via INTRAVENOUS

## 2018-09-05 MED ORDER — PROCHLORPERAZINE EDISYLATE 10 MG/2ML IJ SOLN
10.0000 mg | Freq: Once | INTRAMUSCULAR | Status: AC
  Administered 2018-09-05: 10 mg via INTRAVENOUS
  Filled 2018-09-05: qty 2

## 2018-09-05 NOTE — ED Provider Notes (Signed)
Patient handed off to me by previous ED PA at shift change pending MRI.  Please see previous note for full H&P.  Briefly, patient is a 37 year old female with history of migraines who presents for right monocular blurred vision with dilated pupil and headache.  She has history of same in the past but never had such pronounced pupil dilation.  Previous team consulted neurology Dr. Rory Percy who recommended imaging including CT angiography and MRI to rule out MS and other intra-vascular/intracranial abnormality.  Patient has received a migraine cocktail.  Plan is to reassess symptoms and follow-up on imaging.  If normal MRI patient is deemed appropriate for discharge with headache specialist.  Consider reconsult neurology for any imaging abnormalities.  Physical Exam  BP (!) 102/56 (BP Location: Right Arm)   Pulse (!) 56   Temp 97.9 F (36.6 C) (Oral)   Resp 20   SpO2 100%   Physical Exam  Constitutional: She is oriented to person, place, and time. She appears well-developed and well-nourished.  Non-toxic appearance.  No distress.  Laying comfortably in hall bed.  HENT:  Head: Normocephalic.  Right Ear: External ear normal.  Left Ear: External ear normal.  Nose: Nose normal.  Eyes: Conjunctivae and EOM are normal.  Right pupil dilated approximately 6 mm.  Neck: Full passive range of motion without pain.  Cardiovascular: Normal rate.  Pulmonary/Chest: Effort normal. No tachypnea. No respiratory distress.  Musculoskeletal: Normal range of motion.  Neurological: She is alert and oriented to person, place, and time.  Skin: Skin is warm and dry. Capillary refill takes less than 2 seconds.  Psychiatric: Her behavior is normal. Thought content normal.    ED Course/Procedures   Clinical Course as of Sep 05 1657  Thu Sep 05, 2018  1357 Patient with Dr. Rory Percy.  He recommends MRI w and without contrast, evaluate for demyelination, etc. If neg, then migraine cocktail for complicated migraine.  Follow  up with Dr. Delfin Edis   [JR]    Clinical Course User Index [JR] Robinson, [place] N, PA-C    Procedures  MDM   1640: Went to evaluate patient, she is still an MRI.  MRI results pending.  1657: Spoke to patient regarding results.  She reports moderate improvement in her headache and has actually noticed her right pupil has decreased in size as well.  We discussed plan to discharge with follow-up with Dr. Lavell Anchors.  She is comfortable with this plan.  Return instructions given.     Kinnie Feil, PA-C 09/05/18 1658    Jola Schmidt, MD 09/06/18 [phone]

## 2018-09-05 NOTE — ED Triage Notes (Signed)
Patient arrives from work, she states that her vision in her R eye all of a sudden went blurry, pt has significant difference in pupillary size with R pupil being approx 55mm and L pupil 88mm. Pt a/ox4, speech clear, face symmetrical, resp e/u, nad. Pt reports similar history of the same with minimal pupillary size difference and has been diagnosed with complicated migraines.

## 2018-09-05 NOTE — ED Notes (Signed)
Pt back from MRI 

## 2018-09-05 NOTE — ED Notes (Signed)
Patient transported to CT 

## 2018-09-05 NOTE — Discharge Instructions (Signed)
You were seen in the ER for headache and right pupil dilation  We consulted our neurology team and Dr.Arora recommended CT angiography of your head and neck as well as MRI.  Imaging today is normal.  We will discharge you with presumed diagnosis of complicated migraine.  We recommend follow-up with headache specialist as soon as you are able to make an appointment.  Return to the ER for new or worsening symptoms, sudden and severe headache, vision changes such as double vision or blurred vision, difficulty with speech or swallowing, tingling numbness or weakness to one side of your body.

## 2018-09-05 NOTE — ED Notes (Signed)
Patient left at this time with all belongings. 

## 2018-09-05 NOTE — ED Provider Notes (Signed)
[facility] EMERGENCY DEPARTMENT Provider Note   CSN: [id number] Arrival date & time: 09/05/18  1040     History   Chief Complaint Chief Complaint  Patient presents with  . Visual Field Change    HPI Cynthia [name] is a 37 y.o. female with past medical history of anxiety, Crohn's disease, presenting to emergency department with acute onset of blurry vision to the right eye with dilated pupil.  She states her coworker looked at her noticed her right pupil was significantly dilated.  She works at the [employer].  She reports associated mild headache with onset after the blurry vision.  States she has had similar episodes in the past however with minimal pupillary dilation.  She was evaluated by an ophthalmologist who stated she may have complicated migraine versus hormonal imbalance.  Never had formal neuro evaluation or head imaging.  Denies eye pain, photophobia, slurred speech or facial droop.  Denies paresthesias, weakness, numbness.  Denies diplopia.  No injury to her eye.  Does wear contact lenses, however no medications used in her eye.  No known family history of aneurysm.  The history is provided by the patient.    Past Medical History:  Diagnosis Date  . Anxiety   . Crohn disease [facility])     Patient Active Problem List   Diagnosis Date Noted  . Counseling for birth control, oral contraceptives 11/03/2016  . Acute upper respiratory infection 08/16/2016  . Anxiety state 01/24/2015  . Crohn's disease ([place]) 01/24/2015  . [place] INTESTINE 07/06/2010  . WEIGHT LOSS-ABNORMAL 07/06/2010  . DIARRHEA 07/06/2010  . NONSPEC ELEVATION OF LEVELS OF TRANSAMINASE/LDH 01/13/2009  . EXERCISE INDUCED ASTHMA 03/18/2008  . CROHN'S DISEASE 03/18/2008  . ANAL FISTULA 03/18/2008  . PSORIASIS 03/18/2008  . OSTEOPENIA 03/18/2008  . ANEMIA, HX OF 03/18/2008    Past Surgical History:  Procedure Laterality Date  . NO PAST SURGERIES        OB History    Gravida  0   Para  0   Term  0   Preterm  0   AB  0   Living  0     SAB  0   TAB  0   Ectopic  0   Multiple  0   Live Births               Home Medications    Prior to Admission medications   Medication Sig Start Date End Date Taking? Authorizing Provider  chlorpheniramine-HYDROcodone (TUSSIONEX PENNKINETIC ER) 10-8 MG/5ML SUER Take 5 mLs by mouth every 12 (twelve) hours as needed. 11/14/17   Coral Spikes, DO  cholecalciferol (VITAMIN D) 1000 units tablet Take 1,000 Units by mouth daily.    [provider]  EPINEPHrine (EPIPEN 2-PAK) 0.3 mg/0.3 mL IJ SOAJ injection Inject 0.3 mLs (0.3 mg total) into the muscle once. 12/13/15   Veatrice Bourbon, MD  etonogestrel (NEXPLANON) 68 MG IMPL implant 1 each by Subdermal route once.    [provider]  Prenatal Multivit-Min-Fe-FA (PRENATAL VITAMINS PO) Take 1 tablet by mouth daily.    [provider]  sertraline (ZOLOFT) 50 MG tablet Take 1.5 tablets (75 mg total) by mouth daily. 05/23/17   Steve Rattler, DO  ustekinumab (STELARA) 90 MG/ML SOSY injection Inject 1 mL (90 mg total) into the skin every 6 (six) weeks. 03/26/18   Tresa Garter, MD    Family History Family History  Problem  Relation Age of Onset  . Diabetes Unknown        grandparent  . Skin cancer Unknown        grandfather  . Stroke Unknown        grandmother  . Healthy Mother   . Healthy Father   . Breast cancer Neg Hx     Social History Social History   Tobacco Use  . Smoking status: Never Smoker  . Smokeless tobacco: Never Used  Substance Use Topics  . Alcohol use: Yes    Alcohol/week: 1.0 standard drinks    Types: 1 Glasses of wine per week    Comment: occasional  . Drug use: No     Allergies   Almond meal and Soy allergy   Review of Systems Review of Systems  Eyes: Positive for photophobia and visual disturbance. Negative for pain and redness.  Neurological: Positive for  headaches.  All other systems reviewed and are negative.    Physical Exam Updated Vital Signs BP 135/66   Pulse 61   Temp 97.9 F (36.6 C) (Oral)   Resp 20   SpO2 100%   Physical Exam  Constitutional: She appears well-developed and well-nourished. No distress.  HENT:  Head: Normocephalic and atraumatic.  Eyes: Conjunctivae are normal.  Neck: Normal range of motion. Neck supple.  Cardiovascular: Normal rate, regular rhythm and normal heart sounds.  Pulmonary/Chest: Effort normal and breath sounds normal. No respiratory distress.  Abdominal: Soft.  Neurological: She is alert.  Mental Status:  Alert, oriented, thought content appropriate, able to give a coherent history. Speech fluent without evidence of aphasia. Able to follow 2 step commands without difficulty.  Cranial Nerves:  II:  Peripheral visual fields grossly normal bilaterally  left pupil is 81mm, round, reactive to light  right pupil is 7-69mm minimally reactive to light, round, Minimal consensual pupillary restriction to right eye. No consensual photophobia. III,IV, VI: ptosis not present, extra-ocular motions intact bilaterally  V,VII: smile symmetric, facial light touch sensation equal VIII: hearing grossly normal to voice  X: uvula elevates symmetrically  XI: bilateral shoulder shrug symmetric and strong XII: midline tongue extension without fassiculations Motor:  Normal tone. 5/5 in upper and lower extremities bilaterally including strong and equal grip strength and dorsiflexion/plantar flexion Sensory: Pinprick and light touch normal in all extremities.  Deep Tendon Reflexes: 2+ and symmetric in the biceps and patella Cerebellar: normal finger-to-nose with bilateral upper extremities Gait: normal gait and balance CV: distal pulses palpable throughout    Skin: Skin is warm.  Psychiatric: She has a normal mood and affect. Her behavior is normal.  Nursing note and vitals reviewed.    ED Treatments / Results   Labs (all labs ordered are listed, but only abnormal results are displayed) Labs Reviewed  BASIC METABOLIC PANEL  CBC WITH DIFFERENTIAL/PLATELET  I-STAT BETA HCG BLOOD, ED ([facility], [facility], [facility] ONLY)    EKG None  Radiology Ct Angio Head W Or Wo Contrast  Result Date: 09/05/2018 CLINICAL DATA:  Asymmetric right pupillary dilatation with visual disturbance. EXAM: CT ANGIOGRAPHY HEAD AND NECK TECHNIQUE: Multidetector CT imaging of the head and neck was performed using the standard protocol during bolus administration of intravenous contrast. Multiplanar CT image reconstructions and MIPs were obtained to evaluate the vascular anatomy. Carotid stenosis measurements (when applicable) are obtained utilizing NASCET criteria, using the distal internal carotid diameter as the denominator. CONTRAST:  61mL ISOVUE-370 IOPAMIDOL (ISOVUE-370) INJECTION 76% COMPARISON:  None. FINDINGS: CT HEAD FINDINGS Brain: There is no  evidence of acute infarct, intracranial hemorrhage, mass, midline shift, or extra-axial fluid collection. The ventricles and sulci are normal. Vascular: As below. Skull: No fracture or focal osseous lesion. Sinuses: Paranasal sinuses and mastoid air cells are clear. Orbits: Unremarkable. Review of the MIP images confirms the above findings CTA NECK FINDINGS Aortic arch: Standard 3 vessel aortic arch with widely patent arch vessel origins. Right carotid system: Patent without evidence of stenosis or dissection. Left carotid system: Patent without evidence of stenosis or dissection. Vertebral arteries: The vertebral arteries are patent with the left being mildly dominant. No significant stenosis or dissection is identified, however a portion of the right V1 segment is obscured by streak artifact from venous contrast. Skeleton: Reversal of the normal cervical lordosis. Other neck: 6 mm low-density right thyroid nodule, not large enough to warrant routine ultrasound evaluation. Upper chest: Clear lung apices.  Review of the MIP images confirms the above findings CTA HEAD FINDINGS Anterior circulation: The internal carotid arteries are widely patent from skull base to carotid termini. ACAs and MCAs are patent without evidence of proximal branch occlusion or significant stenosis. No aneurysm is identified. Posterior circulation: The intracranial vertebral arteries are widely patent to the basilar. Patent left PICA, right AICA, and bilateral SCA origins are identified. The basilar artery is widely patent. Patent posterior communicating arteries are present bilaterally. Both PCAs are present without evidence of significant stenosis. No aneurysm is identified. Venous sinuses: Patent. Anatomic variants: None. Delayed phase: No abnormal enhancement. Review of the MIP images confirms the above findings IMPRESSION: 1. Negative head and neck CTA. No large vessel occlusion, dissection, or aneurysm allowing for partial obscuration of the right V1 segment. 2. Unremarkable CT appearance of the brain without evidence of acute intracranial abnormality. Electronically Signed   By: Logan Bores M.D.   On: 09/05/2018 13:23   Ct Angio Neck W And/or Wo Contrast  Result Date: 09/05/2018 CLINICAL DATA:  Asymmetric right pupillary dilatation with visual disturbance. EXAM: CT ANGIOGRAPHY HEAD AND NECK TECHNIQUE: Multidetector CT imaging of the head and neck was performed using the standard protocol during bolus administration of intravenous contrast. Multiplanar CT image reconstructions and MIPs were obtained to evaluate the vascular anatomy. Carotid stenosis measurements (when applicable) are obtained utilizing NASCET criteria, using the distal internal carotid diameter as the denominator. CONTRAST:  88mL ISOVUE-370 IOPAMIDOL (ISOVUE-370) INJECTION 76% COMPARISON:  None. FINDINGS: CT HEAD FINDINGS Brain: There is no evidence of acute infarct, intracranial hemorrhage, mass, midline shift, or extra-axial fluid collection. The ventricles and  sulci are normal. Vascular: As below. Skull: No fracture or focal osseous lesion. Sinuses: Paranasal sinuses and mastoid air cells are clear. Orbits: Unremarkable. Review of the MIP images confirms the above findings CTA NECK FINDINGS Aortic arch: Standard 3 vessel aortic arch with widely patent arch vessel origins. Right carotid system: Patent without evidence of stenosis or dissection. Left carotid system: Patent without evidence of stenosis or dissection. Vertebral arteries: The vertebral arteries are patent with the left being mildly dominant. No significant stenosis or dissection is identified, however a portion of the right V1 segment is obscured by streak artifact from venous contrast. Skeleton: Reversal of the normal cervical lordosis. Other neck: 6 mm low-density right thyroid nodule, not large enough to warrant routine ultrasound evaluation. Upper chest: Clear lung apices. Review of the MIP images confirms the above findings CTA HEAD FINDINGS Anterior circulation: The internal carotid arteries are widely patent from skull base to carotid termini. ACAs and MCAs are patent without evidence of  proximal branch occlusion or significant stenosis. No aneurysm is identified. Posterior circulation: The intracranial vertebral arteries are widely patent to the basilar. Patent left PICA, right AICA, and bilateral SCA origins are identified. The basilar artery is widely patent. Patent posterior communicating arteries are present bilaterally. Both PCAs are present without evidence of significant stenosis. No aneurysm is identified. Venous sinuses: Patent. Anatomic variants: None. Delayed phase: No abnormal enhancement. Review of the MIP images confirms the above findings IMPRESSION: 1. Negative head and neck CTA. No large vessel occlusion, dissection, or aneurysm allowing for partial obscuration of the right V1 segment. 2. Unremarkable CT appearance of the brain without evidence of acute intracranial abnormality.  Electronically Signed   By: Logan Bores M.D.   On: 09/05/2018 13:23    Procedures Procedures (including critical care time)  Medications Ordered in ED Medications - No data to display   Initial Impression / Assessment and Plan / ED Course  I have reviewed the triage vital signs and the nursing notes.  Pertinent labs & imaging results that were available during my care of the patient were reviewed by me and considered in my medical decision making (see chart for details).  Clinical Course as of Sep 05 1518  Thu Sep 05, 2018  1357 Patient with Dr. Rory Percy.  He recommends MRI w and without contrast, evaluate for demyelination, etc. If neg, then migraine cocktail for complicated migraine.  Follow up with Dr. Delfin Edis   [JR]    Clinical Course User Index [JR] Olson Lucarelli, [place] N, PA-C    Patient presented with acute onset of blurry vision to the right eye with associated pupillary dilation.  She does have mild headache. No eye pain. Has history of intermittent migraines, also reports occurrence of unequal pupils, however never this pronounced.  Evaluated by ophthalmology, however no formal neuro eval, no head imaging done in the past. On exam, she has significantly dilated pupil compared to the right, minimally reactive. Normal visual fields. No other neuro deficits. CTA head and neck ordered to rule out aneurysm vs mass; imaging negative. Migraine cocktail ordered. Consulted neurology. Dr. Rory Percy recommending MRI to evaluate for demyelination. If MRI neg, he recommends pt safe for discharge with follow up with headache specialist, Dr. Sarina Ill.   Care handed off at shift change to [place], [place]. Plan to follow up MRI and dispo.  Patient discussed with Dr. Regenia Skeeter.  Final Clinical Impressions(s) / ED Diagnoses   Final diagnoses:  None    ED Discharge Orders    None       Jaquae Rieves, [place] N, PA-C 09/05/18 1519    Sherwood Gambler, MD 09/06/18 [phone]

## 2018-09-17 DIAGNOSIS — E2839 Other primary ovarian failure: Secondary | ICD-10-CM | POA: Diagnosis not present

## 2018-09-17 DIAGNOSIS — N978 Female infertility of other origin: Secondary | ICD-10-CM | POA: Diagnosis not present

## 2018-09-17 DIAGNOSIS — H5213 Myopia, bilateral: Secondary | ICD-10-CM | POA: Diagnosis not present

## 2018-09-17 DIAGNOSIS — N970 Female infertility associated with anovulation: Secondary | ICD-10-CM | POA: Diagnosis not present

## 2018-09-17 DIAGNOSIS — Z3181 Encounter for male factor infertility in female patient: Secondary | ICD-10-CM | POA: Diagnosis not present

## 2018-09-18 MED FILL — CLOMIPHENE CITRATE 50 MG TA: 50 | 5 days supply | Qty: 5 | Fill #0

## 2018-09-24 MED FILL — STELARA 90 MG/ML SOSY: 90 | 42 days supply | Qty: 1 | Fill #4

## 2018-10-08 DIAGNOSIS — F411 Generalized anxiety disorder: Secondary | ICD-10-CM | POA: Diagnosis not present

## 2018-10-16 DIAGNOSIS — F411 Generalized anxiety disorder: Secondary | ICD-10-CM | POA: Diagnosis not present

## 2018-10-22 DIAGNOSIS — F411 Generalized anxiety disorder: Secondary | ICD-10-CM | POA: Diagnosis not present

## 2018-10-29 DIAGNOSIS — F411 Generalized anxiety disorder: Secondary | ICD-10-CM | POA: Diagnosis not present

## 2018-11-05 DIAGNOSIS — F411 Generalized anxiety disorder: Secondary | ICD-10-CM | POA: Diagnosis not present

## 2018-11-06 ENCOUNTER — Encounter: Payer: Self-pay | Admitting: Neurology

## 2018-11-06 ENCOUNTER — Ambulatory Visit: Payer: Self-pay | Admitting: Neurology

## 2018-11-06 VITALS — BP 106/61 | HR 65 | Ht 65.0 in | Wt 125.0 lb

## 2018-11-06 DIAGNOSIS — G43109 Migraine with aura, not intractable, without status migrainosus: Secondary | ICD-10-CM

## 2018-11-06 NOTE — Progress Notes (Signed)
[facility]    Provider:  Dr Jaynee Eagles Referring Provider: Verita Lamb, NP Primary Care Provider:  Verita Lamb, NP  CC:  Pupil dilation with headaches   HPI:  Cynthia [name] is a 38 y.o. female here as requested by provider Verita Lamb, NP for complicated migraines.  She has a past medical history of anxiety, Crohn's disease, and complicated migraine who presented to the emergency room in early December 2019 for acute onset of blurry vision to the right eye with dilated pupil. She reports that she first noticed this a couple of years ago. She would wake up with one pupil larger than the other and shortly after she would have a headache. She was seen by ophthalmology and was told it was probably hormonal. In 09/2018 She was at work and noticed that her eyes suddenly became blurry. She thought that something was wrong with her contact. When looking in mirror she noticed that her right eye was fully dilated and left pupil was constricted. She had extensive workup in the ER that was normal. She was given migraine cocktail. She became very anxious and scared after taking benadryl and is uncertain if the cocktail helped. The next day she woke up without a headache. She has visualized her right pupil pulsating when looking in the mirror. She has had a recent normal eye exam.   She will have a headache about once weekly. Headaches can last up to 24 hours.  Headaches vary in location. Mostly frontal. Can be unilateral or bilateral. She describes a sensation that her eyes are tired. Can be pounding. She feels that it is hard to focus. She gets nauseated at times and has light and sound sensitivity. She has had one headache in college where she had an aura but not since again. She usually treats with Tylenol. She may take 1000mg  2-3 times a month. She has never been treated with preventative therapy. She is taking sertraline daily. Mom and grandmother had migraines/complex migraines.  She is trying to become pregnant.   Reviewed notes, labs and imaging from outside physicians, which showed   I reviewed patient's chart.  Patient was seen at the emergency room in early December 2019.  She stated that her vision in her right all of a sudden went blurry.  On exam she had significant difference in pupillary side with the right pupil being approximately 7 mm and left pupil 2 mm.  She was alert and oriented x4, speech clear, face symmetrical, no acute distress.  This has happened in the past and diagnosed with complicated migraines.  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  She reported associated mild headache with onset after the blurry vision.  She has had similar episodes in the past also with pupillary changes.  She was evaluated by an ophthalmologist who stated she may have complicated migraines versus hormonal imbalance.  Denied eye pain, photophobia, slurred speech or or facial droop, paresthesias, weakness, numbness, diplopia, injury, no new medications or any use near her eye no family history of aneurysm.  On exam she had minimal consensual pupillary restriction to the right eye, otherwise her neurologic exam was normal.  CTA of the head and neck ordered due to rule out aneurysm versus mass.  Migraine cocktail was ordered and neurology was consulted.   MRI 09/05/2018 showed No acute intracranial abnormalities including mass lesion or mass effect, hydrocephalus, extra-axial fluid collection, midline shift, hemorrhage, or acute  infarction, large ischemic events (personally reviewed images)  CTA angio of the head and neck: reviewed report:  IMPRESSION: 1. Negative head and neck CTA. No large vessel occlusion, dissection, or aneurysm allowing for partial obscuration of the right V1 segment. 2. Unremarkable CT appearance of the brain without evidence of acute intracranial abnormality.   Review of Systems: Patient  complains of symptoms per HPI as well as the following symptoms: migraines. Pertinent negatives and positives per HPI. All others negative.   Social History   Socioeconomic History  . Marital status: Married    Spouse name: Not on file  . Number of children: Not on file  . Years of education: Not on file  . Highest education level: Doctorate  Occupational History  . Not on file  Social Needs  . Financial resource strain: Not on file  . Food insecurity:    Worry: Not on file    Inability: Not on file  . Transportation needs:    Medical: Not on file    Non-medical: Not on file  Tobacco Use  . Smoking status: Never Smoker  . Smokeless tobacco: Never Used  Substance and Sexual Activity  . Alcohol use: Yes    Alcohol/week: 1.0 standard drinks    Types: 1 Glasses of wine per week    Comment: 12 oz / weekend   . Drug use: Never  . Sexual activity: Yes    Birth control/protection: Other-see comments  Lifestyle  . Physical activity:    Days per week: Not on file    Minutes per session: Not on file  . Stress: Not on file  Relationships  . Social connections:    Talks on phone: Not on file    Gets together: Not on file    Attends religious service: Not on file    Active member of club or organization: Not on file    Attends meetings of clubs or organizations: Not on file    Relationship status: Not on file  . Intimate partner violence:    Fear of current or ex partner: Not on file    Emotionally abused: Not on file    Physically abused: Not on file    Forced sexual activity: Not on file  Other Topics Concern  . Not on file  Social History Narrative   Lives at home with husband & step daughter   Right handed   Caffeine: 12 oz daily    Family History  Problem Relation Age of Onset  . Diabetes Other        grandparent  . Skin cancer Other        grandfather  . Stroke Other        grandmother  . Healthy Mother   . Migraines Mother   . Healthy Father   . Ulcers  Father        GI   . Migraines Maternal Grandmother   . Dementia Maternal Grandmother        secondary to stroke  . Diabetes type II Maternal Grandfather   . Heart attack Maternal Grandfather   . Diabetes type II Paternal Grandmother   . Dementia Paternal Grandmother        secondary to stroke  . Skin cancer Paternal Grandfather   . Breast cancer Neg Hx     Past Medical History:  Diagnosis Date  . Anxiety   . Crohn disease ([place])   . Migraine     Patient Active Problem List   Diagnosis  Date Noted  . Counseling for birth control, oral contraceptives 11/03/2016  . Acute upper respiratory infection 08/16/2016  . Anxiety state 01/24/2015  . Crohn's disease ([place]) 01/24/2015  . [place] INTESTINE 07/06/2010  . WEIGHT LOSS-ABNORMAL 07/06/2010  . DIARRHEA 07/06/2010  . NONSPEC ELEVATION OF LEVELS OF TRANSAMINASE/LDH 01/13/2009  . EXERCISE INDUCED ASTHMA 03/18/2008  . CROHN'S DISEASE 03/18/2008  . ANAL FISTULA 03/18/2008  . PSORIASIS 03/18/2008  . OSTEOPENIA 03/18/2008  . ANEMIA, HX OF 03/18/2008    Past Surgical History:  Procedure Laterality Date  . NO PAST SURGERIES      Current Outpatient Medications  Medication Sig Dispense Refill  . cholecalciferol (VITAMIN D) 1000 units tablet Take 1,000 Units by mouth daily.    Marland Kitchen EPINEPHrine (EPIPEN 2-PAK) 0.3 mg/0.3 mL IJ SOAJ injection Inject 0.3 mLs (0.3 mg total) into the muscle once. 1 Device 3  . Prenatal Multivit-Min-Fe-FA (PRENATAL VITAMINS PO) Take 1 tablet by mouth daily.    . sertraline (ZOLOFT) 50 MG tablet Take 1.5 tablets (75 mg total) by mouth daily. (Patient taking differently: Take 50 mg by mouth daily. ) 45 tablet 5  . ustekinumab (STELARA) 90 MG/ML SOSY injection Inject 1 mL (90 mg total) into the skin every 6 (six) weeks. 1 mL 8   No current facility-administered medications for this visit.     Allergies as of 11/06/2018 - Review Complete 11/06/2018  Allergen Reaction Noted  . 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 Other Anaphylaxis 12/11/2011  . Soy allergy Hives 12/11/2011  . Eggs or egg-derived products Diarrhea, Nausea Only, and Nausea And Vomiting 12/18/2017    Vitals: BP 106/61 (BP Location: Right Arm, Patient Position: Sitting)   Pulse 65   Ht 5\' 5"  (1.651 m)   Wt 125 lb (56.7 kg)   BMI 20.80 kg/m  Last Weight:  Wt Readings from Last 1 Encounters:  11/06/18 125 lb (56.7 kg)   Last Height:   Ht Readings from Last 1 Encounters:  11/06/18 5\' 5"  (1.651 m)     Physical exam: Exam: Gen: NAD, conversant, well nourised, well groomed                     CV: RRR, no MRG. No Carotid Bruits. No peripheral edema, warm, nontender Eyes: Conjunctivae clear without exudates or hemorrhage  Neuro: Detailed Neurologic Exam  Speech:    Speech is normal; fluent and spontaneous with normal comprehension.  Cognition:    The patient is oriented to person, place, and time;     recent and remote memory intact;     language fluent;     normal attention, concentration,     fund of knowledge Cranial Nerves:    The pupils are equal, round, and reactive to light. The fundi are normal and spontaneous venous pulsations are present. Visual fields are full to finger confrontation. Extraocular movements are intact. Trigeminal sensation is intact and the muscles of mastication are normal. The face is symmetric. The palate elevates in the midline. Hearing intact. Voice is normal. Shoulder shrug is normal. The tongue has normal motion without fasciculations.   Coordination:    Normal finger to nose and heel to shin. Normal rapid alternating movements.   Gait:    Heel-toe and tandem gait are normal.   Motor Observation:    No asymmetry, no atrophy, and no involuntary movements noted. Tone:    Normal muscle tone.    Posture:    Posture is normal. normal erect  Strength:    Strength is V/V in the upper and lower limbs.      Sensation: intact to LT     Reflex  Exam:  DTR's:    Deep tendon reflexes in the upper and lower extremities are normal bilaterally.   Toes:    The toes are downgoing bilaterally.   Clonus:    Clonus is absent.    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    - Migraines and other primary headache disorders can affect pupils by interfering with sympathetic or parasympathetic innervation.  Sometimes the pupillary changes are permanent.  There are case reports for migraine causing mydriasis.  -MRI of the brain, CTA of the head and neck were normal.  No other etiology for her mydriasis found.  Symptoms have resolved.  This is a migrainous phenomena that patient has experienced for 2 years and has been evaluated at ophthalmology and been to the emergency room.  I do think at this point is due to her migraines as there is no other cause and this is known to happen in migraines and other primary headache disorders.  -She is going through fertility treatments and may be pregnant.  At this time I would not recommend starting any new medication.  She says Tylenol helps her migraines, I encouraged her to take Tylenol as soon as possible when migraines start.  No medications are entirely safe in pregnancy.    - Also 80% of women improve with respect to their migraines during their second and third trimesters.  -Discussed treatments including Botox, nerve blocks, medications and provided literature on treating migraines during pregnancy and lactation and reviewed some medications that we use and their risks.  There are limited treatments in pregnancy and all have risks. Tylenol is generally accepted as safe in pregnancy.. Discussed at length with patient. Treat migraines early.  Cool Compress. Lie down and place a cool compress on your head.  Avoid headache triggers. If certain foods or odors seem to have triggered your migraines in the past, avoid them. A headache diary might help you identify triggers.   Include physical activity in your daily routine. Try a daily walk or other moderate aerobic exercise.  Manage stress. Find healthy ways to cope with the stressors, such as delegating tasks on your to-do list.  Practice relaxation techniques. Try deep breathing, yoga, massage and visualization.  Eat regularly. Eating regularly scheduled meals and maintaining a healthy diet might help prevent headaches. Also, drink plenty of fluids.  Follow a regular sleep schedule. Sleep deprivation might contribute to headaches  Consider biofeedback. With this mind-body technique, you learn to control certain bodily functions - such as muscle tension, heart rate and blood pressure - to prevent headaches or reduce headache pain.   Headaches during pregnancy are common. However, if you develop a severe headache or a headache that doesn't go away, call your health care provider. Severe headaches can be a sign of a pregnancy complication   Discussed:  There is increased risk for stroke in women with migraine with aura and a  Contraindication for the combined contraceptive pill for use by women who have migraine with aura, which is in line with World Health Organisation recommendations. The risk for women with migraine without aura is lower and other risk factors like smoking are far more likely to increase stroke risk than migraine. There is a recommendation for no smoking and for the use of low estrogen or progestogen only pills particularly  for women with migraine with aura. It is important however that women with migraine who are taking the pill do not decide to suddenly stop taking it without discussing this with their doctor. Please discuss with her OB/GYN.   Cc: Verita Lamb, NP,    Sarina Ill, MD  [facility] [address] [place], [place] [address]  Phone [phone] Fax [phone]

## 2018-11-06 NOTE — Patient Instructions (Signed)
We will continue to monitor closely  Treat headache at onset with Tylenol 1000mg  We will consider preventative/abortive medications as needed Please seek medical attention immediately for any signs/symptoms of stroke.  Follow up as needed   Migraine Headache A migraine headache is an intense, throbbing pain on one side or both sides of the head. Migraines may also cause other symptoms, such as nausea, vomiting, and sensitivity to light and noise. What are the causes? Doing or taking certain things may also trigger migraines, such as:  Alcohol.  Smoking.  Medicines, such as: ? Medicine used to treat chest pain (nitroglycerine). ? Birth control pills. ? Estrogen pills. ? Certain blood pressure medicines.  Aged cheeses, chocolate, or caffeine.  Foods or drinks that contain nitrates, glutamate, aspartame, or tyramine.  Physical activity. Other things that may trigger a migraine include:  Menstruation.  Pregnancy.  Hunger.  Stress, lack of sleep, too much sleep, or fatigue.  Weather changes. What increases the risk? The following factors may make you more likely to experience migraine headaches:  Age. Risk increases with age.  Family history of migraine headaches.  Being Caucasian.  Depression and anxiety.  Obesity.  Being a woman.  Having a hole in the heart (patent foramen ovale) or other heart problems. What are the signs or symptoms? The main symptom of this condition is pulsating or throbbing pain. Pain may:  Happen in any area of the head, such as on one side or both sides.  Interfere with daily activities.  Get worse with physical activity.  Get worse with exposure to bright lights or loud noises. Other symptoms may include:  Nausea.  Vomiting.  Dizziness.  General sensitivity to bright lights, loud noises, or smells. Before you get a migraine, you may get warning signs that a migraine is developing (aura). An aura may include:  Seeing  flashing lights or having blind spots.  Seeing bright spots, halos, or zigzag lines.  Having tunnel vision or blurred vision.  Having numbness or a tingling feeling.  Having trouble talking.  Having muscle weakness. How is this diagnosed? A migraine headache can be diagnosed based on:  Your symptoms.  A physical exam.  Tests, such as CT scan or MRI of the head. These imaging tests can help rule out other causes of headaches.  Taking fluid from the spine (lumbar puncture) and analyzing it (cerebrospinal fluid analysis, or CSF analysis). How is this treated? A migraine headache is usually treated with medicines that:  Relieve pain.  Relieve nausea.  Prevent migraines from coming back. Treatment may also include:  Acupuncture.  Lifestyle changes like avoiding foods that trigger migraines. Follow these instructions at home: Medicines  Take over-the-counter and prescription medicines only as told by your health care provider.  Do not drive or use heavy machinery while taking prescription pain medicine.  To prevent or treat constipation while you are taking prescription pain medicine, your health care provider may recommend that you: ? Drink enough fluid to keep your urine clear or pale yellow. ? Take over-the-counter or prescription medicines. ? Eat foods that are high in fiber, such as fresh fruits and vegetables, whole grains, and beans. ? Limit foods that are high in fat and processed sugars, such as fried and sweet foods. Lifestyle  Avoid alcohol use.  Do not use any products that contain nicotine or tobacco, such as cigarettes and e-cigarettes. If you need help quitting, ask your health care provider.  Get at least 8 hours of sleep every night.  Limit your stress. General instructions      Keep a journal to find out what may trigger your migraine headaches. For example, write down: ? What you eat and drink. ? How much sleep you get. ? Any change to your  diet or medicines.  If you have a migraine: ? Avoid things that make your symptoms worse, such as bright lights. ? It may help to lie down in a dark, quiet room. ? Do not drive or use heavy machinery. ? Ask your health care provider what activities are safe for you while you are experiencing symptoms.  Keep all follow-up visits as told by your health care provider. This is important. Contact a health care provider if:  You develop symptoms that are different or more severe than your usual migraine symptoms. Get help right away if:  Your migraine becomes severe.  You have a fever.  You have a stiff neck.  You have vision loss.  Your muscles feel weak or like you cannot control them.  You start to lose your balance often.  You develop trouble walking.  You faint. This information is not intended to replace advice given to you by your health care provider. Make sure you discuss any questions you have with your health care provider. Document Released: 09/18/2005 Document Revised: 04/07/2016 Document Reviewed: 03/06/2016 Elsevier Interactive Patient Education  2019 [employer].

## 2018-11-11 MED FILL — STELARA 90 MG/ML SOSY: 90 | 42 days supply | Qty: 1 | Fill #5 | Status: TO

## 2018-11-12 DIAGNOSIS — F411 Generalized anxiety disorder: Secondary | ICD-10-CM | POA: Diagnosis not present

## 2018-11-18 MED FILL — SERTRALINE HCL 50 MG TABLET: 50 | 90 days supply | Qty: 90 | Fill #3

## 2018-11-19 DIAGNOSIS — F411 Generalized anxiety disorder: Secondary | ICD-10-CM | POA: Diagnosis not present

## 2018-11-26 DIAGNOSIS — F411 Generalized anxiety disorder: Secondary | ICD-10-CM | POA: Diagnosis not present

## 2018-11-28 DIAGNOSIS — K5010 Crohn's disease of large intestine without complications: Secondary | ICD-10-CM | POA: Diagnosis not present

## 2018-11-28 DIAGNOSIS — L409 Psoriasis, unspecified: Secondary | ICD-10-CM | POA: Diagnosis not present

## 2018-11-28 DIAGNOSIS — Z79899 Other long term (current) drug therapy: Secondary | ICD-10-CM | POA: Diagnosis not present

## 2018-12-03 DIAGNOSIS — F411 Generalized anxiety disorder: Secondary | ICD-10-CM | POA: Diagnosis not present

## 2018-12-10 DIAGNOSIS — F411 Generalized anxiety disorder: Secondary | ICD-10-CM | POA: Diagnosis not present

## 2018-12-17 DIAGNOSIS — F411 Generalized anxiety disorder: Secondary | ICD-10-CM | POA: Diagnosis not present

## 2018-12-26 MED FILL — STELARA 90 MG/ML SOSY: 90 | 42 days supply | Qty: 1 | Fill #0

## 2019-01-07 DIAGNOSIS — F411 Generalized anxiety disorder: Secondary | ICD-10-CM | POA: Diagnosis not present

## 2019-01-14 DIAGNOSIS — F411 Generalized anxiety disorder: Secondary | ICD-10-CM | POA: Diagnosis not present

## 2019-01-16 ENCOUNTER — Other Ambulatory Visit: Payer: Self-pay

## 2019-01-16 ENCOUNTER — Encounter: Payer: Self-pay | Admitting: Pharmacist

## 2019-01-16 ENCOUNTER — Ambulatory Visit (INDEPENDENT_AMBULATORY_CARE_PROVIDER_SITE_OTHER): Payer: 59 | Admitting: Pharmacist

## 2019-01-16 DIAGNOSIS — Z79899 Other long term (current) drug therapy: Secondary | ICD-10-CM

## 2019-01-16 NOTE — Progress Notes (Signed)
S: Patient presents today for review of her specialty medication  Patient is currently taking Stelara for Crohn's disease. Patient is managed by Dr. Alisia Ferrari for this.   Efficacy: She feels like Stelara continues to work well for her. She has failed Humira, Enbrel, and Cimzia in the past.   Adherence: denies any missed doses.  Dosing: 90 mg subq every 6 weeks.  Screening: TB test: done yearly for work Hepatitis: completed  Monitoring: S/sx of infection: denies CBC: reports WNL S/sx Reversible posterior leukoencephalopathy syndrome (RPLS - sx include headache, seizures, confusion, and visual disturbances): denies Squamous cell skin carcinoma: denies  Denies any adverse effects.   O:     Lab Results  Component Value Date   WBC 8.2 09/05/2018   HGB 13.4 09/05/2018   HCT 43.6 09/05/2018   MCV 87.0 09/05/2018   PLT 289 09/05/2018      Chemistry      Component Value Date/Time   NA 137 09/05/2018 1055   K 3.6 09/05/2018 1055   CL 101 09/05/2018 1055   CO2 29 09/05/2018 1055   BUN 7 09/05/2018 1055   CREATININE 0.83 09/05/2018 1055      Component Value Date/Time   CALCIUM 9.3 09/05/2018 1055   ALKPHOS 60 05/09/2010 1442   AST 14 05/09/2010 1442   ALT 12 05/09/2010 1442   BILITOT 0.3 05/09/2010 1442       A/P: 1. Medication review: Patient on Stelara for Crohn's disease and is tolerating Stelara well with no adverse effects from the it and continued efficacy.  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 Reviewed the medication with the patient, including the following: Stelara, ustekinumab, is a TNF blocker.  There is an increased risk of infection and malignancy with this medication. Stressed good infection prevention techniques, especially in light of COVID19 pandemic. Do not give patients live vaccinations while they are on this medication. No recommendations for any changes at this time.  Christella Hartigan, PharmD, BCPS, BCACP, CPP Clinical Pharmacist  Practitioner  [phone]

## 2019-01-21 DIAGNOSIS — F411 Generalized anxiety disorder: Secondary | ICD-10-CM | POA: Diagnosis not present

## 2019-01-28 DIAGNOSIS — F411 Generalized anxiety disorder: Secondary | ICD-10-CM | POA: Diagnosis not present

## 2019-02-04 DIAGNOSIS — F411 Generalized anxiety disorder: Secondary | ICD-10-CM | POA: Diagnosis not present

## 2019-02-11 DIAGNOSIS — F411 Generalized anxiety disorder: Secondary | ICD-10-CM | POA: Diagnosis not present

## 2019-02-11 MED FILL — STELARA 90 MG/ML SOSY: 90 | 42 days supply | Qty: 1 | Fill #1

## 2019-02-11 MED FILL — SERTRALINE HCL 50 MG TABS: 50 | 90 days supply | Qty: 90 | Fill #0

## 2019-02-18 DIAGNOSIS — F411 Generalized anxiety disorder: Secondary | ICD-10-CM | POA: Diagnosis not present

## 2019-02-25 DIAGNOSIS — F411 Generalized anxiety disorder: Secondary | ICD-10-CM | POA: Diagnosis not present

## 2019-03-04 DIAGNOSIS — F411 Generalized anxiety disorder: Secondary | ICD-10-CM | POA: Diagnosis not present

## 2019-03-10 DIAGNOSIS — N898 Other specified noninflammatory disorders of vagina: Secondary | ICD-10-CM | POA: Diagnosis not present

## 2019-03-10 DIAGNOSIS — Z13820 Encounter for screening for osteoporosis: Secondary | ICD-10-CM | POA: Diagnosis not present

## 2019-03-10 DIAGNOSIS — R300 Dysuria: Secondary | ICD-10-CM | POA: Diagnosis not present

## 2019-03-11 DIAGNOSIS — F411 Generalized anxiety disorder: Secondary | ICD-10-CM | POA: Diagnosis not present

## 2019-03-24 MED FILL — STELARA 90 MG/ML SOSY: 90 | 42 days supply | Qty: 1 | Fill #0

## 2019-03-25 DIAGNOSIS — F411 Generalized anxiety disorder: Secondary | ICD-10-CM | POA: Diagnosis not present

## 2019-03-27 ENCOUNTER — Other Ambulatory Visit: Payer: Self-pay | Admitting: Family

## 2019-03-27 DIAGNOSIS — Z13820 Encounter for screening for osteoporosis: Secondary | ICD-10-CM

## 2019-04-08 DIAGNOSIS — L409 Psoriasis, unspecified: Secondary | ICD-10-CM | POA: Diagnosis not present

## 2019-04-08 DIAGNOSIS — F411 Generalized anxiety disorder: Secondary | ICD-10-CM | POA: Diagnosis not present

## 2019-04-08 DIAGNOSIS — D229 Melanocytic nevi, unspecified: Secondary | ICD-10-CM | POA: Diagnosis not present

## 2019-04-08 DIAGNOSIS — L814 Other melanin hyperpigmentation: Secondary | ICD-10-CM | POA: Diagnosis not present

## 2019-04-15 DIAGNOSIS — F411 Generalized anxiety disorder: Secondary | ICD-10-CM | POA: Diagnosis not present

## 2019-04-29 DIAGNOSIS — F411 Generalized anxiety disorder: Secondary | ICD-10-CM | POA: Diagnosis not present

## 2019-05-06 ENCOUNTER — Other Ambulatory Visit: Payer: Self-pay

## 2019-05-06 ENCOUNTER — Ambulatory Visit
Admission: RE | Admit: 2019-05-06 | Discharge: 2019-05-06 | Disposition: A | Discharge status: Home / Self Care | Payer: 59 | Source: Ambulatory Visit | Attending: Family | Admitting: Family

## 2019-05-06 DIAGNOSIS — K5010 Crohn's disease of large intestine without complications: Secondary | ICD-10-CM | POA: Diagnosis not present

## 2019-05-06 DIAGNOSIS — Z13820 Encounter for screening for osteoporosis: Secondary | ICD-10-CM

## 2019-05-06 DIAGNOSIS — F411 Generalized anxiety disorder: Secondary | ICD-10-CM | POA: Diagnosis not present

## 2019-05-06 DIAGNOSIS — Z780 Asymptomatic menopausal state: Secondary | ICD-10-CM | POA: Diagnosis not present

## 2019-05-13 DIAGNOSIS — F411 Generalized anxiety disorder: Secondary | ICD-10-CM | POA: Diagnosis not present

## 2019-05-15 ENCOUNTER — Other Ambulatory Visit: Payer: Self-pay | Admitting: Pharmacist

## 2019-05-15 MED ORDER — USTEKINUMAB 90 MG/ML ~~LOC~~ SOSY: 90.0000 mg | PREFILLED_SYRINGE | SUBCUTANEOUS | 8 refills | Status: DC

## 2019-05-15 MED FILL — STELARA 90 MG/ML SOSY: 90 | 42 days supply | Qty: 1 | Fill #0

## 2019-05-19 MED FILL — SERTRALINE HCL 50 MG TABLET: 50 | 90 days supply | Qty: 90 | Fill #0

## 2019-05-20 DIAGNOSIS — F411 Generalized anxiety disorder: Secondary | ICD-10-CM | POA: Diagnosis not present

## 2019-06-10 DIAGNOSIS — F411 Generalized anxiety disorder: Secondary | ICD-10-CM | POA: Diagnosis not present

## 2019-06-17 DIAGNOSIS — F411 Generalized anxiety disorder: Secondary | ICD-10-CM | POA: Diagnosis not present

## 2019-06-24 DIAGNOSIS — F411 Generalized anxiety disorder: Secondary | ICD-10-CM | POA: Diagnosis not present

## 2019-07-01 DIAGNOSIS — F411 Generalized anxiety disorder: Secondary | ICD-10-CM | POA: Diagnosis not present

## 2019-07-02 MED FILL — STELARA 90 MG/ML SOSY: 90 | 42 days supply | Qty: 1 | Fill #1

## 2019-07-08 DIAGNOSIS — F411 Generalized anxiety disorder: Secondary | ICD-10-CM | POA: Diagnosis not present

## 2019-07-15 DIAGNOSIS — F411 Generalized anxiety disorder: Secondary | ICD-10-CM | POA: Diagnosis not present

## 2019-07-29 DIAGNOSIS — Z0189 Encounter for other specified special examinations: Secondary | ICD-10-CM | POA: Diagnosis not present

## 2019-07-29 DIAGNOSIS — N949 Unspecified condition associated with female genital organs and menstrual cycle: Secondary | ICD-10-CM | POA: Diagnosis not present

## 2019-07-29 DIAGNOSIS — Z0000 Encounter for general adult medical examination without abnormal findings: Secondary | ICD-10-CM | POA: Diagnosis not present

## 2019-07-29 DIAGNOSIS — Z01419 Encounter for gynecological examination (general) (routine) without abnormal findings: Secondary | ICD-10-CM | POA: Diagnosis not present

## 2019-08-01 DIAGNOSIS — Z0000 Encounter for general adult medical examination without abnormal findings: Secondary | ICD-10-CM | POA: Diagnosis not present

## 2019-08-02 ENCOUNTER — Telehealth: Payer: 59 | Admitting: Nurse Practitioner

## 2019-08-02 DIAGNOSIS — Z20822 Contact with and (suspected) exposure to covid-19: Secondary | ICD-10-CM

## 2019-08-02 DIAGNOSIS — Z1159 Encounter for screening for other viral diseases: Secondary | ICD-10-CM | POA: Diagnosis not present

## 2019-08-02 DIAGNOSIS — R519 Headache, unspecified: Secondary | ICD-10-CM

## 2019-08-02 DIAGNOSIS — R52 Pain, unspecified: Secondary | ICD-10-CM

## 2019-08-02 DIAGNOSIS — Z20828 Contact with and (suspected) exposure to other viral communicable diseases: Secondary | ICD-10-CM

## 2019-08-02 DIAGNOSIS — R0602 Shortness of breath: Secondary | ICD-10-CM

## 2019-08-02 MED ORDER — ALBUTEROL SULFATE HFA 108 (90 BASE) MCG/ACT IN AERS: 2.0000 | INHALATION_SPRAY | Freq: Four times a day (QID) | RESPIRATORY_TRACT | 0 refills | Status: DC | PRN

## 2019-08-02 NOTE — Progress Notes (Signed)
E-Visit for Corona Virus Screening   Your current symptoms could be consistent with the coronavirus.  Many health care providers can now test patients at their office but not all are.  [facility] has multiple testing sites. For information on our COVID testing locations and hours go to [email or web address]  Please quarantine yourself while awaiting your test results.  We are enrolling you in our [place] for [place] . Daily you will receive a questionnaire within the [facility] website. Our COVID 19 response team willl be monitoriing your responses daily.  You can go to one of the  testing sites listed below, while they are opened (see hours). You do not need a doctors order to be tested for covid.You do need to self-isolate until your results return and if positive 14 days from when your symptoms started and until you are 3 days symptom free.   Testing Locations (Monday - Friday, 8 a.m. - 3:30 p.m.)   [place]: [facility] Entrance), [address] [place], [place]: [address], [place], [place], [place] (entrance off [address] (Closed each Monday): [address] [place], [place] - the short stay covered drive at [facility] (Use the [employer] entrance to [facility] next to [place] is a respiratory illness with symptoms that are similar to the flu. Symptoms are typically mild to moderate, but there have been cases of severe illness and death due to the virus. The following symptoms may appear 2-14 days after exposure: . Fever . Cough . Shortness of breath or difficulty breathing . Chills . Repeated shaking with chills . Muscle pain . Headache . Sore throat . New loss of taste or smell . Fatigue . Congestion or runny nose . Nausea or vomiting . Diarrhea  It is vitally  important that if you feel that you have an infection such as this virus or any other virus that you stay home and away from places where you may spread it to others.  You should self-quarantine for 14 days if you have symptoms that could potentially be coronavirus or have been in close contact a with a person diagnosed with COVID-19 within the last 2 weeks. You should avoid contact with people age 71 and older.   You should wear a mask or cloth face covering over your nose and mouth if you must be around other people or animals, including pets (even at home). Try to stay at least 6 feet away from other people. This will protect the people around you.  You can use medication such as A prescription inhaler called Albuterol MDI 90 mcg /actuation 2 puffs every 4 hours as needed for shortness of breath, wheezing, cough  You may also take acetaminophen (Tylenol) as needed for fever.   Reduce your risk of any infection by using the same precautions used for avoiding the common cold or flu:  Marland Kitchen Wash your hands often with soap and warm water for at least 20 seconds.  If soap and water are not readily available, use an alcohol-based hand sanitizer with at least 60% alcohol.  . If coughing or sneezing, cover your mouth and nose by coughing or sneezing into the elbow areas of your shirt or coat, into a tissue or into your sleeve (not your hands). . Avoid shaking hands with others and consider head nods  or verbal greetings only. . Avoid touching your eyes, nose, or mouth with unwashed hands.  . Avoid close contact with people who are sick. . Avoid places or events with large numbers of people in one location, like concerts or sporting events. . Carefully consider travel plans you have or are making. . If you are planning any travel outside or inside the [place], visit the CDC's Travelers' Health webpage for the latest health notices. . If you have some symptoms but not all symptoms, continue to monitor at home and  seek medical attention if your symptoms worsen. . If you are having a medical emergency, call 911.  HOME CARE . Only take medications as instructed by your medical team. . Drink plenty of fluids and get plenty of rest. . A steam or ultrasonic humidifier can help if you have congestion.   GET HELP RIGHT AWAY IF YOU HAVE EMERGENCY WARNING SIGNS** FOR COVID-19. If you or someone is showing any of these signs seek emergency medical care immediately. Call 911 or proceed to your closest emergency facility if: . You develop worsening high fever. . Trouble breathing . Bluish lips or face . Persistent pain or pressure in the chest . New confusion . Inability to wake or stay awake . You cough up blood. . Your symptoms become more severe  **This list is not all possible symptoms. Contact your medical provider for any symptoms that are sever or concerning to you.   MAKE SURE YOU   Understand these instructions.  Will watch your condition.  Will get help right away if you are not doing well or get worse.  Your e-visit answers were reviewed by a board certified advanced clinical practitioner to complete your personal care plan.  Depending on the condition, your plan could have included both over the counter or prescription medications.  If there is a problem please reply once you have received a response from your provider.  Your safety is important to [place].  If you have drug allergies check your prescription carefully.    You can use MyChart to ask questions about today's visit, request a non-urgent call back, or ask for a work or school excuse for 24 hours related to this e-Visit. If it has been greater than 24 hours you will need to follow up with your provider, or enter a new e-Visit to address those concerns. You will get an e-mail in the next two days asking about your experience.  I hope that your e-visit has been valuable and will speed your recovery. Thank you for using e-visits.   5-10  minutes spent reviewing and documenting in chart.

## 2019-08-05 DIAGNOSIS — F411 Generalized anxiety disorder: Secondary | ICD-10-CM | POA: Diagnosis not present

## 2019-08-18 MED FILL — STELARA 90 MG/ML SOSY: 90 | 42 days supply | Qty: 1 | Fill #2

## 2019-08-19 DIAGNOSIS — F411 Generalized anxiety disorder: Secondary | ICD-10-CM | POA: Diagnosis not present

## 2019-08-19 MED FILL — SERTRALINE HCL 50 MG TABLET: 50 | 90 days supply | Qty: 90 | Fill #0

## 2019-08-26 DIAGNOSIS — F411 Generalized anxiety disorder: Secondary | ICD-10-CM | POA: Diagnosis not present

## 2019-09-02 DIAGNOSIS — F411 Generalized anxiety disorder: Secondary | ICD-10-CM | POA: Diagnosis not present

## 2019-09-04 MED FILL — CIPROFLOXACIN HCL 500 MG TA: 500 | 10 days supply | Qty: 20 | Fill #0

## 2019-09-09 DIAGNOSIS — F411 Generalized anxiety disorder: Secondary | ICD-10-CM | POA: Diagnosis not present

## 2019-09-16 DIAGNOSIS — F411 Generalized anxiety disorder: Secondary | ICD-10-CM | POA: Diagnosis not present

## 2019-09-18 DIAGNOSIS — H5213 Myopia, bilateral: Secondary | ICD-10-CM | POA: Diagnosis not present

## 2019-09-23 DIAGNOSIS — F411 Generalized anxiety disorder: Secondary | ICD-10-CM | POA: Diagnosis not present

## 2019-09-30 MED FILL — STELARA 90 MG/ML SOSY: 90 | 42 days supply | Qty: 1 | Fill #3

## 2019-10-07 DIAGNOSIS — F411 Generalized anxiety disorder: Secondary | ICD-10-CM | POA: Diagnosis not present

## 2019-10-09 DIAGNOSIS — Z79899 Other long term (current) drug therapy: Secondary | ICD-10-CM | POA: Diagnosis not present

## 2019-10-09 DIAGNOSIS — K5010 Crohn's disease of large intestine without complications: Secondary | ICD-10-CM | POA: Diagnosis not present

## 2019-10-09 DIAGNOSIS — E611 Iron deficiency: Secondary | ICD-10-CM | POA: Diagnosis not present

## 2019-10-09 DIAGNOSIS — L409 Psoriasis, unspecified: Secondary | ICD-10-CM | POA: Diagnosis not present

## 2019-10-09 DIAGNOSIS — E559 Vitamin D deficiency, unspecified: Secondary | ICD-10-CM | POA: Diagnosis not present

## 2019-10-09 DIAGNOSIS — D500 Iron deficiency anemia secondary to blood loss (chronic): Secondary | ICD-10-CM | POA: Diagnosis not present

## 2019-10-14 DIAGNOSIS — E611 Iron deficiency: Secondary | ICD-10-CM | POA: Diagnosis not present

## 2019-10-14 DIAGNOSIS — F411 Generalized anxiety disorder: Secondary | ICD-10-CM | POA: Diagnosis not present

## 2019-10-14 DIAGNOSIS — E559 Vitamin D deficiency, unspecified: Secondary | ICD-10-CM | POA: Diagnosis not present

## 2019-10-14 DIAGNOSIS — K5010 Crohn's disease of large intestine without complications: Secondary | ICD-10-CM | POA: Diagnosis not present

## 2019-10-16 MED FILL — POLY-IRON 150 MG CAPSULE: 150 | 30 days supply | Qty: 60 | Fill #0

## 2019-10-21 DIAGNOSIS — F411 Generalized anxiety disorder: Secondary | ICD-10-CM | POA: Diagnosis not present

## 2019-10-28 DIAGNOSIS — F411 Generalized anxiety disorder: Secondary | ICD-10-CM | POA: Diagnosis not present

## 2019-11-04 DIAGNOSIS — F411 Generalized anxiety disorder: Secondary | ICD-10-CM | POA: Diagnosis not present

## 2019-11-10 MED FILL — STELARA 90 MG/ML SOSY: 90 | 42 days supply | Qty: 1 | Fill #0

## 2019-11-11 DIAGNOSIS — F411 Generalized anxiety disorder: Secondary | ICD-10-CM | POA: Diagnosis not present

## 2019-11-18 MED FILL — SERTRALINE HCL 50 MG TABLET: 50 | 90 days supply | Qty: 90 | Fill #1

## 2019-11-25 DIAGNOSIS — F411 Generalized anxiety disorder: Secondary | ICD-10-CM | POA: Diagnosis not present

## 2019-12-02 DIAGNOSIS — F411 Generalized anxiety disorder: Secondary | ICD-10-CM | POA: Diagnosis not present

## 2019-12-12 MED FILL — POLY-IRON 150 MG CAPSULE: 150 | 30 days supply | Qty: 60 | Fill #1

## 2019-12-15 DIAGNOSIS — Z01812 Encounter for preprocedural laboratory examination: Secondary | ICD-10-CM | POA: Diagnosis not present

## 2019-12-15 DIAGNOSIS — Z20822 Contact with and (suspected) exposure to covid-19: Secondary | ICD-10-CM | POA: Diagnosis not present

## 2019-12-16 DIAGNOSIS — F411 Generalized anxiety disorder: Secondary | ICD-10-CM | POA: Diagnosis not present

## 2019-12-17 DIAGNOSIS — K5090 Crohn's disease, unspecified, without complications: Secondary | ICD-10-CM | POA: Diagnosis not present

## 2019-12-17 DIAGNOSIS — Z1211 Encounter for screening for malignant neoplasm of colon: Secondary | ICD-10-CM | POA: Diagnosis not present

## 2019-12-17 DIAGNOSIS — J45909 Unspecified asthma, uncomplicated: Secondary | ICD-10-CM | POA: Diagnosis not present

## 2019-12-17 DIAGNOSIS — K6389 Other specified diseases of intestine: Secondary | ICD-10-CM | POA: Diagnosis not present

## 2019-12-17 DIAGNOSIS — K5010 Crohn's disease of large intestine without complications: Secondary | ICD-10-CM | POA: Diagnosis not present

## 2019-12-17 DIAGNOSIS — K635 Polyp of colon: Secondary | ICD-10-CM | POA: Diagnosis not present

## 2019-12-17 DIAGNOSIS — D123 Benign neoplasm of transverse colon: Secondary | ICD-10-CM | POA: Diagnosis not present

## 2019-12-23 DIAGNOSIS — F411 Generalized anxiety disorder: Secondary | ICD-10-CM | POA: Diagnosis not present

## 2019-12-30 DIAGNOSIS — F411 Generalized anxiety disorder: Secondary | ICD-10-CM | POA: Diagnosis not present

## 2020-01-02 MED FILL — STELARA 90 MG/ML SOSY: 90 | 42 days supply | Qty: 1 | Fill #1

## 2020-01-13 DIAGNOSIS — F411 Generalized anxiety disorder: Secondary | ICD-10-CM | POA: Diagnosis not present

## 2020-01-20 DIAGNOSIS — F411 Generalized anxiety disorder: Secondary | ICD-10-CM | POA: Diagnosis not present

## 2020-01-27 DIAGNOSIS — F411 Generalized anxiety disorder: Secondary | ICD-10-CM | POA: Diagnosis not present

## 2020-02-10 DIAGNOSIS — F411 Generalized anxiety disorder: Secondary | ICD-10-CM | POA: Diagnosis not present

## 2020-02-17 DIAGNOSIS — F411 Generalized anxiety disorder: Secondary | ICD-10-CM | POA: Diagnosis not present

## 2020-02-17 MED FILL — POLY-IRON 150 MG CAPSULE: 150 | 30 days supply | Qty: 60 | Fill #2

## 2020-02-17 MED FILL — SERTRALINE HCL 50 MG TABLET: 50 | 90 days supply | Qty: 90 | Fill #0

## 2020-02-24 DIAGNOSIS — F411 Generalized anxiety disorder: Secondary | ICD-10-CM | POA: Diagnosis not present

## 2020-03-02 DIAGNOSIS — F411 Generalized anxiety disorder: Secondary | ICD-10-CM | POA: Diagnosis not present

## 2020-03-09 DIAGNOSIS — F411 Generalized anxiety disorder: Secondary | ICD-10-CM | POA: Diagnosis not present

## 2020-03-23 DIAGNOSIS — E611 Iron deficiency: Secondary | ICD-10-CM | POA: Diagnosis not present

## 2020-03-23 DIAGNOSIS — E538 Deficiency of other specified B group vitamins: Secondary | ICD-10-CM | POA: Diagnosis not present

## 2020-03-23 DIAGNOSIS — F411 Generalized anxiety disorder: Secondary | ICD-10-CM | POA: Diagnosis not present

## 2020-03-23 DIAGNOSIS — K5010 Crohn's disease of large intestine without complications: Secondary | ICD-10-CM | POA: Diagnosis not present

## 2020-03-23 MED FILL — STELARA 90 MG/ML SOSY: 90 | 42 days supply | Qty: 1 | Fill #3

## 2020-03-30 DIAGNOSIS — K5010 Crohn's disease of large intestine without complications: Secondary | ICD-10-CM | POA: Diagnosis not present

## 2020-03-30 DIAGNOSIS — L409 Psoriasis, unspecified: Secondary | ICD-10-CM | POA: Diagnosis not present

## 2020-03-30 DIAGNOSIS — Z79899 Other long term (current) drug therapy: Secondary | ICD-10-CM | POA: Diagnosis not present

## 2020-03-30 DIAGNOSIS — E611 Iron deficiency: Secondary | ICD-10-CM | POA: Diagnosis not present

## 2020-03-30 DIAGNOSIS — K580 Irritable bowel syndrome with diarrhea: Secondary | ICD-10-CM | POA: Diagnosis not present

## 2020-03-30 MED FILL — OMEPRAZOLE DR 20 MG CAPSULE: 20 | 90 days supply | Qty: 90 | Fill #0

## 2020-04-07 DIAGNOSIS — K580 Irritable bowel syndrome with diarrhea: Secondary | ICD-10-CM | POA: Diagnosis not present

## 2020-04-13 ENCOUNTER — Telehealth: Payer: Self-pay | Admitting: Pharmacist

## 2020-04-13 DIAGNOSIS — F411 Generalized anxiety disorder: Secondary | ICD-10-CM | POA: Diagnosis not present

## 2020-04-13 NOTE — Telephone Encounter (Signed)
Called patient to schedule an appointment for the [facility]. I was unable to reach the patient so I left a HIPAA-compliant message requesting that the patient return my call.   

## 2020-04-20 DIAGNOSIS — F411 Generalized anxiety disorder: Secondary | ICD-10-CM | POA: Diagnosis not present

## 2020-04-27 DIAGNOSIS — F411 Generalized anxiety disorder: Secondary | ICD-10-CM | POA: Diagnosis not present

## 2020-05-04 DIAGNOSIS — F411 Generalized anxiety disorder: Secondary | ICD-10-CM | POA: Diagnosis not present

## 2020-05-05 DIAGNOSIS — F411 Generalized anxiety disorder: Secondary | ICD-10-CM | POA: Diagnosis not present

## 2020-05-05 MED FILL — STELARA 90 MG/ML SOSY: 90 | 42 days supply | Qty: 1 | Fill #4

## 2020-05-11 DIAGNOSIS — F411 Generalized anxiety disorder: Secondary | ICD-10-CM | POA: Diagnosis not present

## 2020-05-17 MED FILL — SERTRALINE HCL 50 MG TABLET: 50 | 90 days supply | Qty: 90 | Fill #1

## 2020-05-18 DIAGNOSIS — F411 Generalized anxiety disorder: Secondary | ICD-10-CM | POA: Diagnosis not present

## 2020-05-25 DIAGNOSIS — F411 Generalized anxiety disorder: Secondary | ICD-10-CM | POA: Diagnosis not present

## 2020-06-01 DIAGNOSIS — L409 Psoriasis, unspecified: Secondary | ICD-10-CM | POA: Diagnosis not present

## 2020-06-01 DIAGNOSIS — L814 Other melanin hyperpigmentation: Secondary | ICD-10-CM | POA: Diagnosis not present

## 2020-06-01 DIAGNOSIS — D229 Melanocytic nevi, unspecified: Secondary | ICD-10-CM | POA: Diagnosis not present

## 2020-06-01 DIAGNOSIS — F411 Generalized anxiety disorder: Secondary | ICD-10-CM | POA: Diagnosis not present

## 2020-06-08 DIAGNOSIS — F411 Generalized anxiety disorder: Secondary | ICD-10-CM | POA: Diagnosis not present

## 2020-06-15 DIAGNOSIS — F411 Generalized anxiety disorder: Secondary | ICD-10-CM | POA: Diagnosis not present

## 2020-06-21 ENCOUNTER — Ambulatory Visit (HOSPITAL_BASED_OUTPATIENT_CLINIC_OR_DEPARTMENT_OTHER): Payer: 59 | Admitting: Pharmacist

## 2020-06-21 ENCOUNTER — Other Ambulatory Visit: Payer: Self-pay

## 2020-06-21 DIAGNOSIS — Z79899 Other long term (current) drug therapy: Secondary | ICD-10-CM

## 2020-06-21 MED ORDER — USTEKINUMAB 90 MG/ML ~~LOC~~ SOSY: 90.0000 mg | PREFILLED_SYRINGE | SUBCUTANEOUS | 5 refills | Status: DC

## 2020-06-21 MED FILL — STELARA 90 MG/ML SOSY: 90 | 42 days supply | Qty: 1 | Fill #0

## 2020-06-21 NOTE — Progress Notes (Signed)
S: Patient presents today for review of her specialty medication  Patient is currently taking Stelara for Crohn's disease. Patient is managed by Dr. Alisia Ferrari for this.   Efficacy: She feels like Stelara continues to work well for her. Had a colonoscopy this year that was consistent with remission. She has failed Humira, Enbrel, and Cimzia in the past.   Adherence: denies any missed doses.  Dosing: 90 mg subq every 6 weeks.  Screening: TB test: done yearly for work Hepatitis: completed  Monitoring: S/sx of infection: denies CBC: reports WNL S/sx Reversible posterior leukoencephalopathy syndrome (RPLS - sx include headache, seizures, confusion, and visual disturbances): denies Squamous cell skin carcinoma: denies  Denies any adverse effects.   O:     Lab Results  Component Value Date   WBC 8.2 09/05/2018   HGB 13.4 09/05/2018   HCT 43.6 09/05/2018   MCV 87.0 09/05/2018   PLT 289 09/05/2018      Chemistry      Component Value Date/Time   NA 137 09/05/2018 1055   K 3.6 09/05/2018 1055   CL 101 09/05/2018 1055   CO2 29 09/05/2018 1055   BUN 7 09/05/2018 1055   CREATININE 0.83 09/05/2018 1055      Component Value Date/Time   CALCIUM 9.3 09/05/2018 1055   ALKPHOS 60 05/09/2010 1442   AST 14 05/09/2010 1442   ALT 12 05/09/2010 1442   BILITOT 0.3 05/09/2010 1442       A/P: 1. Medication review: Patient on Stelara for Crohn's disease and is tolerating Stelara well with no adverse effects from the it and continued efficacy.  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 Reviewed the medication with the patient, including the following: Stelara, ustekinumab, is a TNF blocker.  There is an increased risk of infection and malignancy with this medication. Stressed good infection prevention techniques, especially in light of COVID19 pandemic. Do not give patients live vaccinations while they are on this medication. No recommendations for any changes at this time.  Benard Halsted, PharmD, [place] [phone]

## 2020-06-22 DIAGNOSIS — F411 Generalized anxiety disorder: Secondary | ICD-10-CM | POA: Diagnosis not present

## 2020-06-24 DIAGNOSIS — F411 Generalized anxiety disorder: Secondary | ICD-10-CM | POA: Diagnosis not present

## 2020-06-29 DIAGNOSIS — F411 Generalized anxiety disorder: Secondary | ICD-10-CM | POA: Diagnosis not present

## 2020-06-30 DIAGNOSIS — J069 Acute upper respiratory infection, unspecified: Secondary | ICD-10-CM | POA: Diagnosis not present

## 2020-07-14 DIAGNOSIS — F411 Generalized anxiety disorder: Secondary | ICD-10-CM | POA: Diagnosis not present

## 2020-07-28 DIAGNOSIS — F411 Generalized anxiety disorder: Secondary | ICD-10-CM | POA: Diagnosis not present

## 2020-07-29 MED FILL — STELARA 90 MG/ML SOSY: 90 | 42 days supply | Qty: 1 | Fill #1

## 2020-08-03 DIAGNOSIS — Z01419 Encounter for gynecological examination (general) (routine) without abnormal findings: Secondary | ICD-10-CM | POA: Diagnosis not present

## 2020-08-03 DIAGNOSIS — Z0000 Encounter for general adult medical examination without abnormal findings: Secondary | ICD-10-CM | POA: Diagnosis not present

## 2020-08-04 DIAGNOSIS — F411 Generalized anxiety disorder: Secondary | ICD-10-CM | POA: Diagnosis not present

## 2020-08-11 DIAGNOSIS — Z0000 Encounter for general adult medical examination without abnormal findings: Secondary | ICD-10-CM | POA: Diagnosis not present

## 2020-08-11 DIAGNOSIS — Z13220 Encounter for screening for lipoid disorders: Secondary | ICD-10-CM | POA: Diagnosis not present

## 2020-08-11 DIAGNOSIS — F411 Generalized anxiety disorder: Secondary | ICD-10-CM | POA: Diagnosis not present

## 2020-08-18 ENCOUNTER — Other Ambulatory Visit (HOSPITAL_COMMUNITY): Payer: Self-pay | Admitting: Family

## 2020-08-18 DIAGNOSIS — F419 Anxiety disorder, unspecified: Secondary | ICD-10-CM | POA: Diagnosis not present

## 2020-08-18 DIAGNOSIS — F411 Generalized anxiety disorder: Secondary | ICD-10-CM | POA: Diagnosis not present

## 2020-08-18 MED FILL — SERTRALINE HCL 50 MG TABLET: 50 | 90 days supply | Qty: 90 | Fill #0

## 2020-08-20 DIAGNOSIS — F411 Generalized anxiety disorder: Secondary | ICD-10-CM | POA: Diagnosis not present

## 2020-08-25 DIAGNOSIS — F411 Generalized anxiety disorder: Secondary | ICD-10-CM | POA: Diagnosis not present

## 2020-09-01 DIAGNOSIS — D84821 Immunodeficiency due to drugs: Secondary | ICD-10-CM | POA: Diagnosis not present

## 2020-09-01 DIAGNOSIS — Z79899 Other long term (current) drug therapy: Secondary | ICD-10-CM | POA: Diagnosis not present

## 2020-09-01 DIAGNOSIS — E611 Iron deficiency: Secondary | ICD-10-CM | POA: Diagnosis not present

## 2020-09-01 DIAGNOSIS — K5010 Crohn's disease of large intestine without complications: Secondary | ICD-10-CM | POA: Diagnosis not present

## 2020-09-01 DIAGNOSIS — F411 Generalized anxiety disorder: Secondary | ICD-10-CM | POA: Diagnosis not present

## 2020-09-08 DIAGNOSIS — F411 Generalized anxiety disorder: Secondary | ICD-10-CM | POA: Diagnosis not present

## 2020-09-09 MED FILL — STELARA 90 MG/ML SOSY: 90 | 42 days supply | Qty: 1 | Fill #2

## 2020-09-22 DIAGNOSIS — H5213 Myopia, bilateral: Secondary | ICD-10-CM | POA: Diagnosis not present

## 2020-09-22 DIAGNOSIS — F411 Generalized anxiety disorder: Secondary | ICD-10-CM | POA: Diagnosis not present

## 2020-10-06 DIAGNOSIS — F411 Generalized anxiety disorder: Secondary | ICD-10-CM | POA: Diagnosis not present

## 2020-10-13 DIAGNOSIS — F411 Generalized anxiety disorder: Secondary | ICD-10-CM | POA: Diagnosis not present

## 2020-10-20 DIAGNOSIS — F411 Generalized anxiety disorder: Secondary | ICD-10-CM | POA: Diagnosis not present

## 2020-10-26 MED FILL — STELARA 90 MG/ML SOSY: 90 | 42 days supply | Qty: 1 | Fill #3

## 2020-10-27 DIAGNOSIS — F411 Generalized anxiety disorder: Secondary | ICD-10-CM | POA: Diagnosis not present

## 2020-11-10 DIAGNOSIS — F411 Generalized anxiety disorder: Secondary | ICD-10-CM | POA: Diagnosis not present

## 2020-11-15 MED FILL — SERTRALINE HCL 50 MG TABLET: 50 | 90 days supply | Qty: 90 | Fill #1

## 2020-11-24 DIAGNOSIS — F411 Generalized anxiety disorder: Secondary | ICD-10-CM | POA: Diagnosis not present

## 2020-12-01 ENCOUNTER — Other Ambulatory Visit: Payer: Self-pay | Admitting: Pharmacist

## 2020-12-01 DIAGNOSIS — L409 Psoriasis, unspecified: Secondary | ICD-10-CM | POA: Diagnosis not present

## 2020-12-01 DIAGNOSIS — E538 Deficiency of other specified B group vitamins: Secondary | ICD-10-CM | POA: Diagnosis not present

## 2020-12-01 DIAGNOSIS — K50118 Crohn's disease of large intestine with other complication: Secondary | ICD-10-CM | POA: Diagnosis not present

## 2020-12-01 DIAGNOSIS — E611 Iron deficiency: Secondary | ICD-10-CM | POA: Diagnosis not present

## 2020-12-01 DIAGNOSIS — D84821 Immunodeficiency due to drugs: Secondary | ICD-10-CM | POA: Diagnosis not present

## 2020-12-01 DIAGNOSIS — Z79899 Other long term (current) drug therapy: Secondary | ICD-10-CM | POA: Diagnosis not present

## 2020-12-01 MED ORDER — USTEKINUMAB 90 MG/ML ~~LOC~~ SOSY: PREFILLED_SYRINGE | SUBCUTANEOUS | 5 refills | Status: DC

## 2020-12-07 MED FILL — STELARA 90 MG/ML SOSY: 90 | 28 days supply | Qty: 1 | Fill #0

## 2020-12-08 DIAGNOSIS — F411 Generalized anxiety disorder: Secondary | ICD-10-CM | POA: Diagnosis not present

## 2020-12-22 DIAGNOSIS — F411 Generalized anxiety disorder: Secondary | ICD-10-CM | POA: Diagnosis not present

## 2020-12-30 ENCOUNTER — Other Ambulatory Visit (HOSPITAL_COMMUNITY): Payer: Self-pay

## 2021-01-03 ENCOUNTER — Other Ambulatory Visit (HOSPITAL_COMMUNITY): Payer: Self-pay

## 2021-01-03 MED FILL — Ustekinumab Soln Prefilled Syringe 90 MG/ML: SUBCUTANEOUS | 30 days supply | Qty: 1 | Fill #0 | Status: CN

## 2021-01-04 ENCOUNTER — Other Ambulatory Visit (HOSPITAL_COMMUNITY): Payer: Self-pay

## 2021-01-05 ENCOUNTER — Other Ambulatory Visit (HOSPITAL_COMMUNITY): Payer: Self-pay

## 2021-01-05 DIAGNOSIS — F411 Generalized anxiety disorder: Secondary | ICD-10-CM | POA: Diagnosis not present

## 2021-01-06 ENCOUNTER — Other Ambulatory Visit (HOSPITAL_COMMUNITY): Payer: Self-pay

## 2021-01-06 MED FILL — Ustekinumab Soln Prefilled Syringe 90 MG/ML: SUBCUTANEOUS | 28 days supply | Qty: 1 | Fill #0 | Status: AC

## 2021-01-26 ENCOUNTER — Other Ambulatory Visit (HOSPITAL_COMMUNITY): Payer: Self-pay

## 2021-01-26 MED FILL — Ustekinumab Soln Prefilled Syringe 90 MG/ML: SUBCUTANEOUS | 28 days supply | Qty: 1 | Fill #1 | Status: AC

## 2021-02-07 ENCOUNTER — Other Ambulatory Visit (HOSPITAL_COMMUNITY): Payer: Self-pay

## 2021-02-08 ENCOUNTER — Other Ambulatory Visit (HOSPITAL_COMMUNITY): Payer: Self-pay

## 2021-02-09 ENCOUNTER — Other Ambulatory Visit (HOSPITAL_COMMUNITY): Payer: Self-pay

## 2021-02-09 DIAGNOSIS — F411 Generalized anxiety disorder: Secondary | ICD-10-CM | POA: Diagnosis not present

## 2021-02-10 ENCOUNTER — Other Ambulatory Visit (HOSPITAL_COMMUNITY): Payer: Self-pay

## 2021-02-16 DIAGNOSIS — D229 Melanocytic nevi, unspecified: Secondary | ICD-10-CM | POA: Diagnosis not present

## 2021-02-16 DIAGNOSIS — L821 Other seborrheic keratosis: Secondary | ICD-10-CM | POA: Diagnosis not present

## 2021-02-16 DIAGNOSIS — L814 Other melanin hyperpigmentation: Secondary | ICD-10-CM | POA: Diagnosis not present

## 2021-02-23 DIAGNOSIS — F411 Generalized anxiety disorder: Secondary | ICD-10-CM | POA: Diagnosis not present

## 2021-02-25 DIAGNOSIS — F411 Generalized anxiety disorder: Secondary | ICD-10-CM | POA: Diagnosis not present

## 2021-03-02 DIAGNOSIS — F411 Generalized anxiety disorder: Secondary | ICD-10-CM | POA: Diagnosis not present

## 2021-03-14 ENCOUNTER — Other Ambulatory Visit (HOSPITAL_COMMUNITY): Payer: Self-pay

## 2021-03-14 MED FILL — Ustekinumab Soln Prefilled Syringe 90 MG/ML: SUBCUTANEOUS | 28 days supply | Qty: 1 | Fill #2 | Status: AC

## 2021-03-16 DIAGNOSIS — K5010 Crohn's disease of large intestine without complications: Secondary | ICD-10-CM | POA: Diagnosis not present

## 2021-03-16 DIAGNOSIS — E611 Iron deficiency: Secondary | ICD-10-CM | POA: Diagnosis not present

## 2021-03-21 ENCOUNTER — Other Ambulatory Visit (HOSPITAL_COMMUNITY): Payer: Self-pay

## 2021-03-31 ENCOUNTER — Other Ambulatory Visit (HOSPITAL_COMMUNITY): Payer: Self-pay

## 2021-03-31 MED FILL — Sertraline HCl Tab 50 MG: ORAL | 90 days supply | Qty: 90 | Fill #0 | Status: AC

## 2021-04-06 ENCOUNTER — Other Ambulatory Visit (HOSPITAL_COMMUNITY): Payer: Self-pay

## 2021-04-06 DIAGNOSIS — F411 Generalized anxiety disorder: Secondary | ICD-10-CM | POA: Diagnosis not present

## 2021-04-06 MED FILL — Ustekinumab Soln Prefilled Syringe 90 MG/ML: SUBCUTANEOUS | 28 days supply | Qty: 1 | Fill #3 | Status: AC

## 2021-04-08 ENCOUNTER — Other Ambulatory Visit (HOSPITAL_COMMUNITY): Payer: Self-pay

## 2021-04-08 MED ORDER — CIPROFLOXACIN HCL 500 MG PO TABS
500.0000 mg | ORAL_TABLET | Freq: Two times a day (BID) | ORAL | 0 refills | Status: DC
  Filled 2021-04-08: qty 20, 10d supply, fill #0

## 2021-04-12 ENCOUNTER — Other Ambulatory Visit (HOSPITAL_COMMUNITY): Payer: Self-pay

## 2021-04-13 ENCOUNTER — Other Ambulatory Visit (HOSPITAL_COMMUNITY): Payer: Self-pay

## 2021-04-13 DIAGNOSIS — F411 Generalized anxiety disorder: Secondary | ICD-10-CM | POA: Diagnosis not present

## 2021-04-29 DIAGNOSIS — F411 Generalized anxiety disorder: Secondary | ICD-10-CM | POA: Diagnosis not present

## 2021-05-04 ENCOUNTER — Other Ambulatory Visit (HOSPITAL_COMMUNITY): Payer: Self-pay

## 2021-05-04 MED FILL — Ustekinumab Soln Prefilled Syringe 90 MG/ML: SUBCUTANEOUS | 28 days supply | Qty: 1 | Fill #4 | Status: AC

## 2021-05-09 ENCOUNTER — Other Ambulatory Visit (HOSPITAL_COMMUNITY): Payer: Self-pay

## 2021-05-11 DIAGNOSIS — F411 Generalized anxiety disorder: Secondary | ICD-10-CM | POA: Diagnosis not present

## 2021-06-03 ENCOUNTER — Other Ambulatory Visit: Payer: Self-pay

## 2021-06-03 ENCOUNTER — Other Ambulatory Visit (HOSPITAL_COMMUNITY): Payer: Self-pay

## 2021-06-08 DIAGNOSIS — F411 Generalized anxiety disorder: Secondary | ICD-10-CM | POA: Diagnosis not present

## 2021-06-10 ENCOUNTER — Other Ambulatory Visit: Payer: Self-pay

## 2021-06-10 ENCOUNTER — Other Ambulatory Visit (HOSPITAL_COMMUNITY): Payer: Self-pay

## 2021-06-11 ENCOUNTER — Other Ambulatory Visit (HOSPITAL_COMMUNITY): Payer: Self-pay

## 2021-06-13 ENCOUNTER — Other Ambulatory Visit (HOSPITAL_COMMUNITY): Payer: Self-pay

## 2021-06-13 ENCOUNTER — Telehealth: Payer: Self-pay | Admitting: Pharmacist

## 2021-06-13 MED ORDER — STELARA 90 MG/ML ~~LOC~~ SOSY: PREFILLED_SYRINGE | SUBCUTANEOUS | 5 refills | Status: DC

## 2021-06-13 NOTE — Telephone Encounter (Signed)
Called patient to schedule an appointment for the [facility] [employer] [facility]. I was unable to reach the patient so I left a HIPAA-compliant message requesting that the patient return my call.   Luke Van Ausdall, PharmD, BCACP, CPP Clinical Pharmacist [facility] [phone]  

## 2021-06-14 ENCOUNTER — Other Ambulatory Visit (HOSPITAL_COMMUNITY): Payer: Self-pay

## 2021-06-14 ENCOUNTER — Other Ambulatory Visit: Payer: Self-pay

## 2021-06-14 ENCOUNTER — Ambulatory Visit: Payer: 59 | Attending: Family | Admitting: Pharmacist

## 2021-06-14 DIAGNOSIS — Z79899 Other long term (current) drug therapy: Secondary | ICD-10-CM

## 2021-06-14 MED ORDER — STELARA 90 MG/ML ~~LOC~~ SOSY
PREFILLED_SYRINGE | SUBCUTANEOUS | 5 refills | Status: DC
  Filled 2021-06-14: qty 1, 28d supply, fill #0
  Filled 2021-07-06 – 2021-07-20 (×3): qty 1, 28d supply, fill #1
  Filled 2021-08-12: qty 1, 28d supply, fill #2
  Filled 2021-09-07: qty 1, 28d supply, fill #3
  Filled 2021-10-07: qty 1, 28d supply, fill #4
  Filled 2021-11-02: qty 1, 28d supply, fill #5

## 2021-06-14 NOTE — Progress Notes (Signed)
S: Patient presents today for review of her specialty medication  Patient is currently taking Stelara for Crohn's disease. Patient is managed by Dr. Alisia Ferrari for this.   Efficacy: She feels like Stelara continues to work well for her. She has failed Humira, Enbrel, and Cimzia in the past.   Adherence: denies any missed doses.  Dosing: 90 mg subq every 4 weeks.  Screening: TB test: done yearly for work Hepatitis: completed  Monitoring: S/sx of infection: denies CBC: reports WNL S/sx Reversible posterior leukoencephalopathy syndrome (RPLS - sx include headache, seizures, confusion, and visual disturbances): denies Squamous cell skin carcinoma: denies  Denies any adverse effects.   O:     Lab Results  Component Value Date   WBC 8.2 09/05/2018   HGB 13.4 09/05/2018   HCT 43.6 09/05/2018   MCV 87.0 09/05/2018   PLT 289 09/05/2018      Chemistry      Component Value Date/Time   NA 137 09/05/2018 1055   K 3.6 09/05/2018 1055   CL 101 09/05/2018 1055   CO2 29 09/05/2018 1055   BUN 7 09/05/2018 1055   CREATININE 0.83 09/05/2018 1055      Component Value Date/Time   CALCIUM 9.3 09/05/2018 1055   ALKPHOS 60 05/09/2010 1442   AST 14 05/09/2010 1442   ALT 12 05/09/2010 1442   BILITOT 0.3 05/09/2010 1442       A/P: 1. Medication review: Patient on Stelara for Crohn's disease and is tolerating Stelara well with no adverse effects from the it and continued efficacy.  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 Reviewed the medication with the patient, including the following: Stelara, ustekinumab, is a TNF? blocker.  There is an increased risk of infection and malignancy with this medication. Stressed good infection prevention techniques, especially in light of COVID19 pandemic. Do not give patients live vaccinations while they are on this medication. No recommendations for any changes at this time.  Benard Halsted, PharmD, [place] [phone]

## 2021-06-15 ENCOUNTER — Other Ambulatory Visit (HOSPITAL_COMMUNITY): Payer: Self-pay

## 2021-06-22 DIAGNOSIS — F411 Generalized anxiety disorder: Secondary | ICD-10-CM | POA: Diagnosis not present

## 2021-07-04 ENCOUNTER — Other Ambulatory Visit (HOSPITAL_COMMUNITY): Payer: Self-pay

## 2021-07-06 ENCOUNTER — Other Ambulatory Visit (HOSPITAL_COMMUNITY): Payer: Self-pay

## 2021-07-07 DIAGNOSIS — F411 Generalized anxiety disorder: Secondary | ICD-10-CM | POA: Diagnosis not present

## 2021-07-14 ENCOUNTER — Other Ambulatory Visit (HOSPITAL_COMMUNITY): Payer: Self-pay

## 2021-07-18 ENCOUNTER — Other Ambulatory Visit (HOSPITAL_COMMUNITY): Payer: Self-pay

## 2021-07-20 ENCOUNTER — Other Ambulatory Visit (HOSPITAL_COMMUNITY): Payer: Self-pay

## 2021-07-29 DIAGNOSIS — F411 Generalized anxiety disorder: Secondary | ICD-10-CM | POA: Diagnosis not present

## 2021-08-01 ENCOUNTER — Other Ambulatory Visit (HOSPITAL_COMMUNITY): Payer: Self-pay

## 2021-08-01 MED FILL — Sertraline HCl Tab 50 MG: ORAL | 90 days supply | Qty: 90 | Fill #1 | Status: AC

## 2021-08-09 ENCOUNTER — Other Ambulatory Visit (HOSPITAL_COMMUNITY): Payer: Self-pay

## 2021-08-11 ENCOUNTER — Other Ambulatory Visit (HOSPITAL_COMMUNITY): Payer: Self-pay

## 2021-08-12 ENCOUNTER — Other Ambulatory Visit (HOSPITAL_COMMUNITY): Payer: Self-pay

## 2021-08-18 ENCOUNTER — Other Ambulatory Visit (HOSPITAL_COMMUNITY): Payer: Self-pay

## 2021-08-19 ENCOUNTER — Other Ambulatory Visit (HOSPITAL_COMMUNITY): Payer: Self-pay

## 2021-08-24 DIAGNOSIS — F411 Generalized anxiety disorder: Secondary | ICD-10-CM | POA: Diagnosis not present

## 2021-09-07 ENCOUNTER — Other Ambulatory Visit (HOSPITAL_COMMUNITY): Payer: Self-pay

## 2021-09-13 DIAGNOSIS — L438 Other lichen planus: Secondary | ICD-10-CM | POA: Diagnosis not present

## 2021-09-13 DIAGNOSIS — L821 Other seborrheic keratosis: Secondary | ICD-10-CM | POA: Diagnosis not present

## 2021-09-13 DIAGNOSIS — L814 Other melanin hyperpigmentation: Secondary | ICD-10-CM | POA: Diagnosis not present

## 2021-09-13 DIAGNOSIS — L409 Psoriasis, unspecified: Secondary | ICD-10-CM | POA: Diagnosis not present

## 2021-09-13 DIAGNOSIS — D229 Melanocytic nevi, unspecified: Secondary | ICD-10-CM | POA: Diagnosis not present

## 2021-09-13 DIAGNOSIS — D492 Neoplasm of unspecified behavior of bone, soft tissue, and skin: Secondary | ICD-10-CM | POA: Diagnosis not present

## 2021-09-14 DIAGNOSIS — E559 Vitamin D deficiency, unspecified: Secondary | ICD-10-CM | POA: Diagnosis not present

## 2021-09-14 DIAGNOSIS — E611 Iron deficiency: Secondary | ICD-10-CM | POA: Diagnosis not present

## 2021-09-14 DIAGNOSIS — K5010 Crohn's disease of large intestine without complications: Secondary | ICD-10-CM | POA: Diagnosis not present

## 2021-09-14 DIAGNOSIS — F411 Generalized anxiety disorder: Secondary | ICD-10-CM | POA: Diagnosis not present

## 2021-09-15 ENCOUNTER — Other Ambulatory Visit (HOSPITAL_COMMUNITY): Payer: Self-pay

## 2021-09-16 ENCOUNTER — Other Ambulatory Visit (HOSPITAL_COMMUNITY): Payer: Self-pay

## 2021-09-23 DIAGNOSIS — H5213 Myopia, bilateral: Secondary | ICD-10-CM | POA: Diagnosis not present

## 2021-10-07 ENCOUNTER — Other Ambulatory Visit (HOSPITAL_COMMUNITY): Payer: Self-pay

## 2021-10-12 DIAGNOSIS — K50118 Crohn's disease of large intestine with other complication: Secondary | ICD-10-CM | POA: Diagnosis not present

## 2021-10-12 DIAGNOSIS — D84821 Immunodeficiency due to drugs: Secondary | ICD-10-CM | POA: Diagnosis not present

## 2021-10-12 DIAGNOSIS — F411 Generalized anxiety disorder: Secondary | ICD-10-CM | POA: Diagnosis not present

## 2021-10-12 DIAGNOSIS — Z79899 Other long term (current) drug therapy: Secondary | ICD-10-CM | POA: Diagnosis not present

## 2021-10-13 ENCOUNTER — Other Ambulatory Visit (HOSPITAL_COMMUNITY): Payer: Self-pay

## 2021-10-26 DIAGNOSIS — F411 Generalized anxiety disorder: Secondary | ICD-10-CM | POA: Diagnosis not present

## 2021-11-02 ENCOUNTER — Other Ambulatory Visit (HOSPITAL_COMMUNITY): Payer: Self-pay

## 2021-11-02 DIAGNOSIS — R5383 Other fatigue: Secondary | ICD-10-CM | POA: Diagnosis not present

## 2021-11-02 DIAGNOSIS — N951 Menopausal and female climacteric states: Secondary | ICD-10-CM | POA: Diagnosis not present

## 2021-11-02 DIAGNOSIS — Z01419 Encounter for gynecological examination (general) (routine) without abnormal findings: Secondary | ICD-10-CM | POA: Diagnosis not present

## 2021-11-04 ENCOUNTER — Other Ambulatory Visit (HOSPITAL_COMMUNITY): Payer: Self-pay

## 2021-11-04 MED ORDER — SERTRALINE HCL 50 MG PO TABS
50.0000 mg | ORAL_TABLET | Freq: Every day | ORAL | 0 refills | Status: DC
  Filled 2021-11-04: qty 90, 90d supply, fill #0

## 2021-11-07 ENCOUNTER — Other Ambulatory Visit (HOSPITAL_COMMUNITY): Payer: Self-pay

## 2021-11-09 DIAGNOSIS — F411 Generalized anxiety disorder: Secondary | ICD-10-CM | POA: Diagnosis not present

## 2021-11-28 ENCOUNTER — Other Ambulatory Visit (HOSPITAL_COMMUNITY): Payer: Self-pay

## 2021-11-30 ENCOUNTER — Other Ambulatory Visit (HOSPITAL_COMMUNITY): Payer: Self-pay

## 2021-11-30 DIAGNOSIS — F411 Generalized anxiety disorder: Secondary | ICD-10-CM | POA: Diagnosis not present

## 2021-12-02 ENCOUNTER — Other Ambulatory Visit (HOSPITAL_COMMUNITY): Payer: Self-pay

## 2021-12-05 ENCOUNTER — Other Ambulatory Visit (HOSPITAL_COMMUNITY): Payer: Self-pay

## 2021-12-07 ENCOUNTER — Other Ambulatory Visit (HOSPITAL_COMMUNITY): Payer: Self-pay

## 2021-12-07 DIAGNOSIS — F411 Generalized anxiety disorder: Secondary | ICD-10-CM | POA: Diagnosis not present

## 2021-12-08 ENCOUNTER — Other Ambulatory Visit (HOSPITAL_COMMUNITY): Payer: Self-pay

## 2021-12-09 ENCOUNTER — Other Ambulatory Visit (HOSPITAL_COMMUNITY): Payer: Self-pay

## 2021-12-09 ENCOUNTER — Other Ambulatory Visit: Payer: Self-pay | Admitting: Pharmacist

## 2021-12-09 MED ORDER — STELARA 90 MG/ML ~~LOC~~ SOSY
PREFILLED_SYRINGE | SUBCUTANEOUS | 5 refills | Status: AC
  Filled 2021-12-09: qty 1, 28d supply, fill #0
  Filled 2021-12-28: qty 1, 28d supply, fill #1
  Filled 2022-01-24 – 2022-02-03 (×2): qty 1, 28d supply, fill #2
  Filled 2022-03-02: qty 1, 28d supply, fill #3

## 2021-12-09 MED ORDER — STELARA 90 MG/ML ~~LOC~~ SOSY
PREFILLED_SYRINGE | SUBCUTANEOUS | 5 refills | Status: AC
  Filled 2022-03-02: qty 1, 28d supply, fill #0

## 2021-12-14 DIAGNOSIS — F411 Generalized anxiety disorder: Secondary | ICD-10-CM | POA: Diagnosis not present

## 2021-12-21 DIAGNOSIS — F411 Generalized anxiety disorder: Secondary | ICD-10-CM | POA: Diagnosis not present

## 2021-12-26 ENCOUNTER — Other Ambulatory Visit (HOSPITAL_COMMUNITY): Payer: Self-pay

## 2021-12-27 ENCOUNTER — Other Ambulatory Visit (HOSPITAL_COMMUNITY): Payer: Self-pay

## 2021-12-28 ENCOUNTER — Other Ambulatory Visit (HOSPITAL_COMMUNITY): Payer: Self-pay

## 2021-12-28 DIAGNOSIS — F411 Generalized anxiety disorder: Secondary | ICD-10-CM | POA: Diagnosis not present

## 2022-01-05 ENCOUNTER — Other Ambulatory Visit (HOSPITAL_COMMUNITY): Payer: Self-pay

## 2022-01-20 ENCOUNTER — Other Ambulatory Visit (HOSPITAL_COMMUNITY): Payer: Self-pay

## 2022-01-20 DIAGNOSIS — F419 Anxiety disorder, unspecified: Secondary | ICD-10-CM | POA: Diagnosis not present

## 2022-01-20 DIAGNOSIS — Z13220 Encounter for screening for lipoid disorders: Secondary | ICD-10-CM | POA: Diagnosis not present

## 2022-01-20 DIAGNOSIS — Z124 Encounter for screening for malignant neoplasm of cervix: Secondary | ICD-10-CM | POA: Diagnosis not present

## 2022-01-20 DIAGNOSIS — Z0000 Encounter for general adult medical examination without abnormal findings: Secondary | ICD-10-CM | POA: Diagnosis not present

## 2022-01-20 DIAGNOSIS — K50118 Crohn's disease of large intestine with other complication: Secondary | ICD-10-CM | POA: Diagnosis not present

## 2022-01-20 DIAGNOSIS — B3731 Acute candidiasis of vulva and vagina: Secondary | ICD-10-CM | POA: Diagnosis not present

## 2022-01-20 MED ORDER — FLUCONAZOLE 150 MG PO TABS
150.0000 mg | ORAL_TABLET | Freq: Once | ORAL | 1 refills | Status: AC
  Filled 2022-01-20: qty 1, 1d supply, fill #0

## 2022-01-20 MED ORDER — SERTRALINE HCL 50 MG PO TABS
50.0000 mg | ORAL_TABLET | Freq: Every day | ORAL | 3 refills | Status: AC
  Filled 2022-01-20: qty 90, 90d supply, fill #0

## 2022-01-24 ENCOUNTER — Other Ambulatory Visit (HOSPITAL_COMMUNITY): Payer: Self-pay

## 2022-01-27 ENCOUNTER — Other Ambulatory Visit (HOSPITAL_COMMUNITY): Payer: Self-pay

## 2022-02-02 ENCOUNTER — Other Ambulatory Visit (HOSPITAL_COMMUNITY): Payer: Self-pay

## 2022-02-03 ENCOUNTER — Other Ambulatory Visit (HOSPITAL_COMMUNITY): Payer: Self-pay

## 2022-02-07 ENCOUNTER — Other Ambulatory Visit (HOSPITAL_COMMUNITY): Payer: Self-pay

## 2022-02-08 ENCOUNTER — Other Ambulatory Visit (HOSPITAL_COMMUNITY): Payer: Self-pay

## 2022-02-09 ENCOUNTER — Other Ambulatory Visit (HOSPITAL_COMMUNITY): Payer: Self-pay

## 2022-02-10 ENCOUNTER — Other Ambulatory Visit (HOSPITAL_COMMUNITY): Payer: Self-pay

## 2022-02-22 ENCOUNTER — Other Ambulatory Visit (HOSPITAL_COMMUNITY): Payer: Self-pay

## 2022-03-01 ENCOUNTER — Other Ambulatory Visit (HOSPITAL_COMMUNITY): Payer: Self-pay

## 2022-03-01 DIAGNOSIS — F411 Generalized anxiety disorder: Secondary | ICD-10-CM | POA: Diagnosis not present

## 2022-03-02 ENCOUNTER — Other Ambulatory Visit (HOSPITAL_COMMUNITY): Payer: Self-pay

## 2022-03-03 ENCOUNTER — Other Ambulatory Visit (HOSPITAL_COMMUNITY): Payer: Self-pay

## 2022-03-06 ENCOUNTER — Other Ambulatory Visit (HOSPITAL_COMMUNITY): Payer: Self-pay

## 2022-03-07 ENCOUNTER — Other Ambulatory Visit (HOSPITAL_COMMUNITY): Payer: Self-pay

## 2022-03-08 ENCOUNTER — Other Ambulatory Visit (HOSPITAL_COMMUNITY): Payer: Self-pay

## 2022-03-10 ENCOUNTER — Other Ambulatory Visit (HOSPITAL_COMMUNITY): Payer: Self-pay

## 2022-03-11 ENCOUNTER — Other Ambulatory Visit (HOSPITAL_COMMUNITY): Payer: Self-pay

## 2022-03-13 ENCOUNTER — Other Ambulatory Visit (HOSPITAL_COMMUNITY): Payer: Self-pay

## 2022-03-13 MED ORDER — STELARA 90 MG/ML ~~LOC~~ SOSY
PREFILLED_SYRINGE | SUBCUTANEOUS | 5 refills | Status: AC
  Filled 2022-03-13: qty 1, 28d supply, fill #0

## 2022-03-20 ENCOUNTER — Other Ambulatory Visit (HOSPITAL_COMMUNITY): Payer: Self-pay

## 2022-03-21 ENCOUNTER — Other Ambulatory Visit (HOSPITAL_COMMUNITY): Payer: Self-pay

## 2022-11-10 ENCOUNTER — Other Ambulatory Visit: Payer: Self-pay | Admitting: Certified Nurse Midwife

## 2022-11-10 DIAGNOSIS — Z1231 Encounter for screening mammogram for malignant neoplasm of breast: Secondary | ICD-10-CM

## 2022-11-20 ENCOUNTER — Ambulatory Visit
Admission: RE | Admit: 2022-11-20 | Discharge: 2022-11-20 | Disposition: A | Discharge status: Home / Self Care | Payer: PRIVATE HEALTH INSURANCE | Source: Ambulatory Visit | Attending: Certified Nurse Midwife | Admitting: Certified Nurse Midwife

## 2022-11-20 DIAGNOSIS — Z1231 Encounter for screening mammogram for malignant neoplasm of breast: Secondary | ICD-10-CM | POA: Insufficient documentation

## 2023-10-22 ENCOUNTER — Other Ambulatory Visit: Payer: Self-pay | Admitting: Family

## 2023-10-22 DIAGNOSIS — Z1231 Encounter for screening mammogram for malignant neoplasm of breast: Secondary | ICD-10-CM

## 2023-12-04 ENCOUNTER — Ambulatory Visit
Admission: RE | Admit: 2023-12-04 | Discharge: 2023-12-04 | Disposition: A | Discharge status: Home / Self Care | Payer: PRIVATE HEALTH INSURANCE | Source: Ambulatory Visit | Attending: Family | Admitting: Family

## 2023-12-04 DIAGNOSIS — Z1231 Encounter for screening mammogram for malignant neoplasm of breast: Secondary | ICD-10-CM | POA: Insufficient documentation
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
## 2024-09-22 ENCOUNTER — Encounter
Admission: RE | Admit: 2024-09-22 | Discharge: 2024-09-22 | Disposition: A | Discharge status: Home / Self Care | Payer: PRIVATE HEALTH INSURANCE | Source: Ambulatory Visit | Attending: Obstetrics and Gynecology | Admitting: Obstetrics and Gynecology

## 2024-09-22 ENCOUNTER — Other Ambulatory Visit: Payer: Self-pay

## 2024-09-22 DIAGNOSIS — Z01812 Encounter for preprocedural laboratory examination: Secondary | ICD-10-CM

## 2024-09-22 HISTORY — DX: Excessive and frequent menstruation with irregular cycle: N92.1

## 2024-09-22 HISTORY — DX: Family history of other specified conditions: Z84.89

## 2024-09-22 HISTORY — DX: Polyp of corpus uteri: N84.0

## 2024-09-22 HISTORY — DX: Unspecified ovarian cyst, right side: N83.201

## 2024-09-22 HISTORY — DX: Abnormal uterine and vaginal bleeding, unspecified: N93.9

## 2024-09-22 HISTORY — DX: Benign neoplasm of connective and other soft tissue, unspecified: D21.9

## 2024-09-22 HISTORY — DX: Anemia, unspecified: D64.9

## 2024-09-22 HISTORY — DX: Unspecified asthma, uncomplicated: J45.909

## 2024-09-22 NOTE — Patient Instructions (Addendum)
 Your procedure is scheduled on: 09/30/24 - Tuesday Report to the Registration Desk on the 1st floor of the [facility]. To find out your arrival time, please call [phone] between 1PM - 3PM on: 09/29/24 - MONDAY If your arrival time is 6:00 am, do not arrive before that time as the [facility] entrance doors do not open until 6:00 am.  REMEMBER: Instructions that are not followed completely may result in serious medical risk, up to and including death; or upon the discretion of your surgeon and anesthesiologist your surgery may need to be rescheduled.  Do not eat food after midnight the night before surgery.  No gum chewing or hard candies.  You may however, drink CLEAR liquids up to 2 hours before you are scheduled to arrive for your surgery. Do not drink anything within 2 hours of your scheduled arrival time.  Clear liquids include: - water  - apple juice without pulp - gatorade (not RED colors) - black coffee or tea (Do NOT add milk or creamers to the coffee or tea) Do NOT drink anything that is not on this list.  One week prior to surgery: Stop Anti-inflammatories (NSAIDS) such as Advil, Aleve, Ibuprofen, Motrin, Naproxen, Naprosyn and Aspirin based products such as Excedrin, Goody's Powder, BC Powder. You may continue to take Tylenol if needed for pain up until the day of surgery.  Stop ANY OVER THE COUNTER supplements until after surgery.  You may continue to take Tylenol if needed for pain up until the day of surgery.   ON THE DAY OF SURGERY ONLY TAKE THESE MEDICATIONS WITH SIPS OF WATER:  sertraline  (ZOLOFT )    No Alcohol for 24 hours before or after surgery.  No Smoking including e-cigarettes for 24 hours before surgery.  No chewable tobacco products for at least 6 hours before surgery.  No nicotine patches on the day of surgery.  Do not use any recreational drugs for at least a week (preferably 2 weeks) before your surgery.  Please be advised that the  combination of cocaine and anesthesia may have negative outcomes, up to and including death. If you test positive for cocaine, your surgery will be cancelled.  On the morning of surgery brush your teeth with toothpaste and water, you may rinse your mouth with mouthwash if you wish. Do not swallow any toothpaste or mouthwash.  Do not wear jewelry, make-up, hairpins, clips or nail polish.  For welded (permanent) jewelry: bracelets, anklets, waist bands, etc.  Please have this removed prior to surgery.  If it is not removed, there is a chance that hospital personnel will need to cut it off on the day of surgery.  Do not wear lotions, powders, or perfumes.   Do not shave body hair from the neck down 48 hours before surgery.  Contact lenses, hearing aids and dentures may not be worn into surgery.  Do not bring valuables to the hospital. [facility] is not responsible for any missing/lost belongings or valuables.   Notify your doctor if there is any change in your medical condition (cold, fever, infection).  Wear comfortable clothing (specific to your surgery type) to the hospital.  After surgery, you can help prevent lung complications by doing breathing exercises.  Take deep breaths and cough every 1-2 hours. Your doctor may order a device called an Incentive Spirometer to help you take deep breaths.  When coughing or sneezing, hold a pillow firmly against your incision with both hands. This is called splinting. Doing this  helps protect your incision. It also decreases belly discomfort.  If you are being admitted to the hospital overnight, leave your suitcase in the car. After surgery it may be brought to your room.  In case of increased patient census, it may be necessary for you, the patient, to continue your postoperative care in the Same Day Surgery department.  If you are being discharged the day of surgery, you will not be allowed to drive home. You will need a responsible  individual to drive you home and stay with you for 24 hours after surgery.   If you are taking public transportation, you will need to have a responsible individual with you.  Please call the Pre-admissions Testing Dept. at [phone] if you have any questions about these instructions.  Surgery Visitation Policy:  Patients having surgery or a procedure may have two visitors.  Children under the age of 66 must have an adult with them who is not the patient.  Inpatient Visitation:    Visiting hours are 7 a.m. to 8 p.m. Up to four visitors are allowed at one time in a patient room. The visitors may rotate out with other people during the day.  One visitor age 60 or older may stay with the patient overnight and must be in the room by 8 p.m.   Merchandiser, Retail to address health-related social needs:  [email or web address]

## 2024-09-29 NOTE — Anesthesia Preprocedure Evaluation (Signed)
"                                    Anesthesia Evaluation  Patient identified by MRN, date of birth, ID band Patient awake    Reviewed: Allergy & Precautions, H&P , NPO status , Patient's Chart, lab work & pertinent test results  Airway Mallampati: II  TM Distance: >3 FB Neck ROM: full    Dental no notable dental hx.    Pulmonary    Pulmonary exam normal        Cardiovascular negative cardio ROS Normal cardiovascular exam     Neuro/Psych   Anxiety     negative neurological ROS  negative psych ROS   GI/Hepatic negative GI ROS, Neg liver ROS,,,Crohn disease   Endo/Other  negative endocrine ROS    Renal/GU      Musculoskeletal   Abdominal Normal abdominal exam  (+)   Peds  Hematology negative hematology ROS (+)   Anesthesia Other Findings Abnormal uterine bleeding  Past Medical History: No date: Abnormal uterine bleeding (AUB) No date: Anemia No date: Anxiety No date: Asthma     Comment:  AS A CHILD No date: Bilateral ovarian cysts No date: Crohn disease (HCC) No date: Family history of adverse reaction to anesthesia     Comment:  GRANDMOTHER HAD PONV No date: Fibroids No date: Menorrhagia with irregular cycle No date: Migraine No date: Uterine polyp  Past Surgical History: No date: COLONOSCOPY W/ POLYPECTOMY No date: NO PAST SURGERIES No date: WISDOM TOOTH EXTRACTION     Reproductive/Obstetrics negative OB ROS                              Anesthesia Physical Anesthesia Plan  ASA: 2  Anesthesia Plan: General LMA   Post-op Pain Management: Tylenol  PO (pre-op)*   Induction: Intravenous  PONV Risk Score and Plan: Dexamethasone , Ondansetron , Midazolam , Treatment may vary due to age or medical condition and Propofol  infusion  Airway Management Planned: LMA  Additional Equipment:   Intra-op Plan:   Post-operative Plan: Extubation in OR  Informed Consent: I have reviewed the patients History and  Physical, chart, labs and discussed the procedure including the risks, benefits and alternatives for the proposed anesthesia with the patient or authorized representative who has indicated his/her understanding and acceptance.     Dental Advisory Given  Plan Discussed with: Anesthesiologist, CRNA and Surgeon  Anesthesia Plan Comments:          Anesthesia Quick Evaluation  "

## 2024-09-30 ENCOUNTER — Other Ambulatory Visit (HOSPITAL_COMMUNITY): Payer: Self-pay

## 2024-09-30 ENCOUNTER — Ambulatory Visit
Admission: RE | Admit: 2024-09-30 | Discharge: 2024-09-30 | Disposition: A | Discharge status: Home / Self Care | Attending: Obstetrics and Gynecology | Admitting: Obstetrics and Gynecology

## 2024-09-30 ENCOUNTER — Other Ambulatory Visit: Payer: Self-pay

## 2024-09-30 ENCOUNTER — Encounter: Payer: Self-pay | Admitting: Obstetrics and Gynecology

## 2024-09-30 ENCOUNTER — Ambulatory Visit: Payer: Self-pay | Admitting: Anesthesiology

## 2024-09-30 ENCOUNTER — Encounter: Payer: Self-pay | Admitting: Anesthesiology

## 2024-09-30 ENCOUNTER — Encounter
Admission: RE | Disposition: A | Discharge status: Home / Self Care | Payer: Self-pay | Source: Home / Self Care | Attending: Obstetrics and Gynecology

## 2024-09-30 DIAGNOSIS — N840 Polyp of corpus uteri: Secondary | ICD-10-CM | POA: Insufficient documentation

## 2024-09-30 DIAGNOSIS — G8918 Other acute postprocedural pain: Secondary | ICD-10-CM

## 2024-09-30 DIAGNOSIS — N939 Abnormal uterine and vaginal bleeding, unspecified: Secondary | ICD-10-CM | POA: Diagnosis present

## 2024-09-30 DIAGNOSIS — Z01812 Encounter for preprocedural laboratory examination: Secondary | ICD-10-CM

## 2024-09-30 DIAGNOSIS — D259 Leiomyoma of uterus, unspecified: Secondary | ICD-10-CM | POA: Insufficient documentation

## 2024-09-30 DIAGNOSIS — Z30430 Encounter for insertion of intrauterine contraceptive device: Secondary | ICD-10-CM | POA: Diagnosis not present

## 2024-09-30 HISTORY — PX: HYSTEROSCOPY WITH D & C: SHX1775

## 2024-09-30 HISTORY — PX: INTRAUTERINE DEVICE (IUD) INSERTION: SHX5877

## 2024-09-30 HISTORY — PX: HYSTEROSCOPY WITH MYOMECTOMY: SHX7591

## 2024-09-30 LAB — CBC
HCT: 38.4 % (ref 36.0–46.0)
Hemoglobin: 12.5 g/dL (ref 12.0–15.0)
MCH: 27.4 pg (ref 26.0–34.0)
MCHC: 32.6 g/dL (ref 30.0–36.0)
MCV: 84 fL (ref 80.0–100.0)
Platelets: 235 K/uL (ref 150–400)
RBC: 4.57 MIL/uL (ref 3.87–5.11)
RDW: 14.1 % (ref 11.5–15.5)
WBC: 7.2 K/uL (ref 4.0–10.5)
nRBC: 0 % (ref 0.0–0.2)

## 2024-09-30 LAB — POCT PREGNANCY, URINE: Preg Test, Ur: NEGATIVE

## 2024-09-30 SURGERY — DILATATION AND CURETTAGE /HYSTEROSCOPY
Anesthesia: General | Site: Uterus

## 2024-09-30 MED ORDER — LACTATED RINGERS IV SOLN: INTRAVENOUS | Status: DC

## 2024-09-30 MED ORDER — LEVONORGESTREL 20 MCG/DAY IU IUD
1.0000 | INTRAUTERINE_SYSTEM | INTRAUTERINE | Status: AC
  Administered 2024-09-30: 1 via INTRAUTERINE

## 2024-09-30 MED ORDER — MIDAZOLAM HCL 2 MG/2ML IJ SOLN
INTRAMUSCULAR | Status: AC
  Filled 2024-09-30: qty 2

## 2024-09-30 MED ORDER — CHLORHEXIDINE GLUCONATE 0.12 % MT SOLN
OROMUCOSAL | Status: AC
  Filled 2024-09-30: qty 15

## 2024-09-30 MED ORDER — CHLORHEXIDINE GLUCONATE 0.12 % MT SOLN: 15.0000 mL | Freq: Once | OROMUCOSAL | Status: DC

## 2024-09-30 MED ORDER — ORAL CARE MOUTH RINSE: 15.0000 mL | Freq: Once | OROMUCOSAL | Status: DC

## 2024-09-30 MED ORDER — ONDANSETRON HCL 4 MG/2ML IJ SOLN
INTRAMUSCULAR | Status: AC
  Filled 2024-09-30: qty 2

## 2024-09-30 MED ORDER — EPHEDRINE 5 MG/ML INJ
INTRAVENOUS | Status: AC
  Filled 2024-09-30: qty 5

## 2024-09-30 MED ORDER — FENTANYL CITRATE (PF) 100 MCG/2ML IJ SOLN
INTRAMUSCULAR | Status: DC | PRN
  Administered 2024-09-30: 50 ug via INTRAVENOUS

## 2024-09-30 MED ORDER — DEXAMETHASONE SOD PHOSPHATE PF 10 MG/ML IJ SOLN
INTRAMUSCULAR | Status: DC | PRN
  Administered 2024-09-30: 10 mg via INTRAVENOUS

## 2024-09-30 MED ORDER — ACETAMINOPHEN 500 MG PO TABS: 1000.0000 mg | ORAL_TABLET | Freq: Once | ORAL | Status: DC

## 2024-09-30 MED ORDER — SODIUM CHLORIDE 0.9 % IR SOLN
Status: DC | PRN
  Administered 2024-09-30: 3000 mL

## 2024-09-30 MED ORDER — LIDOCAINE HCL (CARDIAC) PF 100 MG/5ML IV SOSY
PREFILLED_SYRINGE | INTRAVENOUS | Status: DC | PRN
  Administered 2024-09-30: 60 mg via INTRAVENOUS

## 2024-09-30 MED ORDER — PROPOFOL 10 MG/ML IV BOLUS
INTRAVENOUS | Status: DC | PRN
  Administered 2024-09-30: 120 mg via INTRAVENOUS

## 2024-09-30 MED ORDER — FENTANYL CITRATE (PF) 100 MCG/2ML IJ SOLN
INTRAMUSCULAR | Status: AC
  Filled 2024-09-30: qty 2

## 2024-09-30 MED ORDER — ACETAMINOPHEN 500 MG PO TABS
ORAL_TABLET | ORAL | Status: AC
  Filled 2024-09-30: qty 2

## 2024-09-30 MED ORDER — DROPERIDOL 2.5 MG/ML IJ SOLN
INTRAMUSCULAR | Status: AC
  Filled 2024-09-30: qty 2

## 2024-09-30 MED ORDER — ONDANSETRON HCL 4 MG/2ML IJ SOLN
INTRAMUSCULAR | Status: DC | PRN
  Administered 2024-09-30: 4 mg via INTRAVENOUS

## 2024-09-30 MED ORDER — CELECOXIB 200 MG PO CAPS: 400.0000 mg | ORAL_CAPSULE | ORAL | Status: DC

## 2024-09-30 MED ORDER — LEVONORGESTREL 20 MCG/DAY IU IUD
INTRAUTERINE_SYSTEM | INTRAUTERINE | Status: AC
  Filled 2024-09-30: qty 1

## 2024-09-30 MED ORDER — OXYCODONE HCL 5 MG/5ML PO SOLN: 5.0000 mg | Freq: Once | ORAL | Status: DC | PRN

## 2024-09-30 MED ORDER — EPHEDRINE SULFATE-NACL 50-0.9 MG/10ML-% IV SOSY
PREFILLED_SYRINGE | INTRAVENOUS | Status: DC | PRN
  Administered 2024-09-30: 5 mg via INTRAVENOUS

## 2024-09-30 MED ORDER — CELECOXIB 200 MG PO CAPS: 200.0000 mg | ORAL_CAPSULE | Freq: Once | ORAL | Status: DC

## 2024-09-30 MED ORDER — 0.9 % SODIUM CHLORIDE (POUR BTL) OPTIME
TOPICAL | Status: DC | PRN
  Administered 2024-09-30: 500 mL

## 2024-09-30 MED ORDER — OXYCODONE-ACETAMINOPHEN 5-325 MG PO TABS
1.0000 | ORAL_TABLET | Freq: Four times a day (QID) | ORAL | 0 refills | Status: AC | PRN
  Filled 2024-09-30 (×2): qty 6, 1d supply, fill #0

## 2024-09-30 MED ORDER — FENTANYL CITRATE (PF) 100 MCG/2ML IJ SOLN
25.0000 ug | INTRAMUSCULAR | Status: DC | PRN
  Administered 2024-09-30 (×2): 25 ug via INTRAVENOUS

## 2024-09-30 MED ORDER — MIDAZOLAM HCL (PF) 2 MG/2ML IJ SOLN
INTRAMUSCULAR | Status: DC | PRN
  Administered 2024-09-30: 2 mg via INTRAVENOUS

## 2024-09-30 MED ORDER — GABAPENTIN 300 MG PO CAPS: 300.0000 mg | ORAL_CAPSULE | Freq: Once | ORAL | Status: DC

## 2024-09-30 MED ORDER — DROPERIDOL 2.5 MG/ML IJ SOLN
0.6250 mg | Freq: Once | INTRAMUSCULAR | Status: AC | PRN
  Administered 2024-09-30: 0.625 mg via INTRAVENOUS

## 2024-09-30 MED ORDER — ACETAMINOPHEN 10 MG/ML IV SOLN: 1000.0000 mg | Freq: Once | INTRAVENOUS | Status: DC | PRN

## 2024-09-30 MED ORDER — OXYCODONE HCL 5 MG PO TABS: 5.0000 mg | ORAL_TABLET | Freq: Once | ORAL | Status: DC | PRN

## 2024-09-30 MED ORDER — CELECOXIB 200 MG PO CAPS
ORAL_CAPSULE | ORAL | Status: AC
  Filled 2024-09-30: qty 2

## 2024-09-30 MED ORDER — POVIDONE-IODINE 10 % EX SWAB: 2.0000 | Freq: Once | CUTANEOUS | Status: DC

## 2024-09-30 MED ORDER — PROPOFOL 10 MG/ML IV BOLUS
INTRAVENOUS | Status: AC
  Filled 2024-09-30: qty 40

## 2024-09-30 MED ORDER — LIDOCAINE HCL (PF) 2 % IJ SOLN
INTRAMUSCULAR | Status: AC
  Filled 2024-09-30: qty 5

## 2024-09-30 MED ORDER — OXYCODONE HCL 5 MG PO TABS
ORAL_TABLET | ORAL | Status: AC
  Filled 2024-09-30: qty 1

## 2024-09-30 MED ORDER — ONDANSETRON 4 MG PO TBDP
4.0000 mg | ORAL_TABLET | Freq: Three times a day (TID) | ORAL | 0 refills | Status: AC | PRN
  Filled 2024-09-30: qty 9, 3d supply, fill #0
  Filled 2024-09-30: qty 20, 7d supply, fill #0

## 2024-09-30 MED ORDER — SILVER NITRATE-POT NITRATE 75-25 % EX MISC
CUTANEOUS | Status: AC
  Filled 2024-09-30: qty 10

## 2024-09-30 MED ORDER — ACETAMINOPHEN 500 MG PO TABS
1000.0000 mg | ORAL_TABLET | ORAL | Status: AC
  Administered 2024-09-30: 1000 mg via ORAL

## 2024-09-30 MED ORDER — GABAPENTIN 300 MG PO CAPS
ORAL_CAPSULE | ORAL | Status: AC
  Filled 2024-09-30: qty 1

## 2024-09-30 MED ORDER — OXYCODONE-ACETAMINOPHEN 5-325 MG PO TABS: 1.0000 | ORAL_TABLET | Freq: Four times a day (QID) | ORAL | 0 refills | Status: AC | PRN

## 2024-09-30 SURGICAL SUPPLY — 24 items
BAG PRESSURE INF REUSE 1000 (BAG) ×3 IMPLANT
BASIN KIT SINGLE STR (MISCELLANEOUS) ×3 IMPLANT
DEVICE MYOSURE LITE (MISCELLANEOUS) IMPLANT
DEVICE MYOSURE REACH (MISCELLANEOUS) IMPLANT
DRSG TELFA 3X8 NADH STRL (GAUZE/BANDAGES/DRESSINGS) IMPLANT
ELECTRODE REM PT RTRN 9FT ADLT (ELECTROSURGICAL) ×3 IMPLANT
GLOVE BIO SURGEON STRL SZ7 (GLOVE) ×3 IMPLANT
GLOVE INDICATOR 7.5 STRL GRN (GLOVE) ×3 IMPLANT
GOWN STRL REUS W/ TWL LRG LVL3 (GOWN DISPOSABLE) ×6 IMPLANT
KIT PROCED FLUENT PRO FLT212S (KITS) ×3 IMPLANT
KIT TURNOVER CYSTO (KITS) ×3 IMPLANT
MANIFOLD NEPTUNE II (INSTRUMENTS) ×3 IMPLANT
Mirena IUD IMPLANT
PACK DNC HYST (MISCELLANEOUS) ×3 IMPLANT
PAD PREP OB/GYN DISP 24X41 (PERSONAL CARE ITEMS) ×3 IMPLANT
SCRUB CHG 4% DYNA-HEX 4OZ (MISCELLANEOUS) ×3 IMPLANT
SEAL ROD LENS SCOPE MYOSURE (ABLATOR) IMPLANT
SET CYSTO IRRIGATION (SET/KITS/TRAYS/PACK) IMPLANT
SOL .9 NS 3000ML IRR UROMATIC (IV SOLUTION) ×3 IMPLANT
SOLN STERILE WATER 500 ML (IV SOLUTION) ×3 IMPLANT
SOLUTION PREP PVP 2OZ (MISCELLANEOUS) ×3 IMPLANT
TOWEL OR 17X26 4PK STRL BLUE (TOWEL DISPOSABLE) ×3 IMPLANT
TRAP FLUID SMOKE EVACUATOR (MISCELLANEOUS) ×3 IMPLANT
TUBING CONNECTING 10 (TUBING) ×3 IMPLANT

## 2024-09-30 NOTE — Transfer of Care (Signed)
 Immediate Anesthesia Transfer of Care Note  Patient: Cynthia [name]  Procedure(s) Performed: DILATATION AND CURETTAGE /HYSTEROSCOPY (Cervix) INSERTION, INTRAUTERINE DEVICE (Uterus) HYSTEROSCOPY WITH MYOMECTOMY  Patient Location: PACU  Anesthesia Type:General  Level of Consciousness: awake  Airway & Oxygen Therapy: Patient Spontanous Breathing and Patient connected to face mask oxygen  Post-op Assessment: Report given to RN and Post -op Vital signs reviewed and stable  Post vital signs: Reviewed and stable  Last Vitals:  Vitals Value Taken Time  BP 122/75 09/30/24 08:30  Temp 97.2   Pulse 66 09/30/24 08:32  Resp 5 09/30/24 08:32  SpO2 100 % 09/30/24 08:32  Vitals shown include unfiled device data.  Last Pain:  Vitals:   09/30/24 0636  PainSc: 2          Complications: No notable events documented.

## 2024-09-30 NOTE — Anesthesia Postprocedure Evaluation (Signed)
"   Anesthesia Post Note  Patient: MARCE SCHARTZ  Procedure(s) Performed: DILATATION AND CURETTAGE /HYSTEROSCOPY (Cervix) INSERTION, INTRAUTERINE DEVICE (Uterus) HYSTEROSCOPY WITH MYOMECTOMY  Patient location during evaluation: PACU Anesthesia Type: General Level of consciousness: awake and alert Pain management: pain level controlled Vital Signs Assessment: post-procedure vital signs reviewed and stable Respiratory status: spontaneous breathing, nonlabored ventilation and respiratory function stable Cardiovascular status: blood pressure returned to baseline and stable Postop Assessment: no apparent nausea or vomiting Anesthetic complications: no   No notable events documented.   Last Vitals:  Vitals:   09/30/24 0910 09/30/24 0922  BP: 114/67 127/61  Pulse: (!) 53 (!) 50  Resp: 16 14  Temp: (!) 36.1 C (!) 36.3 C  SpO2: 100% 100%    Last Pain:  Vitals:   09/30/24 9077  TempSrc: Temporal  PainSc: 3                  Camellia Merilee Louder      "

## 2024-09-30 NOTE — Interval H&P Note (Signed)
 History and Physical Interval Note:  09/30/2024 7:20 AM  Cynthia [name]  has presented today for surgery, with the diagnosis of abnormal uterine bleeding.  The various methods of treatment have been discussed with the patient and family. After consideration of risks, benefits and other options for treatment, the patient has consented to  Procedures with comments: DILATATION AND CURETTAGE /HYSTEROSCOPY (N/A) POLYPECTOMY, CERVIX (N/A) INSERTION, INTRAUTERINE DEVICE (N/A) - INSERT MIRENA  as a surgical intervention.  The patient's history has been reviewed, patient examined, no change in status, stable for surgery.  I have reviewed the patient's chart and labs.  Questions were answered to the patient's satisfaction.     Heather Penton

## 2024-09-30 NOTE — Anesthesia Procedure Notes (Signed)
 Procedure Name: LMA Insertion Date/Time: 09/30/2024 7:41 AM  Performed by: Dyane Mass, CRNAPre-anesthesia Checklist: Patient identified, Emergency Drugs available, Suction available and Patient being monitored Patient Re-evaluated:Patient Re-evaluated prior to induction Oxygen Delivery Method: Circle system utilized Preoxygenation: Pre-oxygenation with 100% oxygen Induction Type: IV induction LMA: LMA flexible inserted LMA Size: 4.0 Number of attempts: 1 Placement Confirmation: positive ETCO2 and breath sounds checked- equal and bilateral Tube secured with: Tape Dental Injury: Teeth and Oropharynx as per pre-operative assessment

## 2024-09-30 NOTE — Discharge Instructions (Signed)
 Discharge instructions after a hysteroscopy with dilation and curettage  Signs and Symptoms to Report  Call our office at [phone] if you have any of the following:    Fever over 100.4 degrees or higher  Severe stomach pain not relieved with pain medications  Bright red bleeding that's heavier than a period that does not slow with rest after the first 24 hours  To go the bathroom a lot (frequency), you can't hold your urine (urgency), or it hurts when you empty your bladder (urinate)  Chest pain  Shortness of breath  Pain in the calves of your legs  Severe nausea and vomiting not relieved with anti-nausea medications  Any concerns  What You Can Expect after Surgery  You may see some pink tinged, bloody fluid. This is normal. You may also have cramping for several days.   Activities after Your Discharge Follow these guidelines to help speed your recovery at home:  Don't drive if you are in pain or taking narcotic pain medicine. You may drive when you can safely slam on the brakes, turn the wheel forcefully, and rotate your torso comfortably. This is typically 4-7 days. Practice in a parking lot or side street prior to attempting to drive regularly.   Ask others to help with household chores for 4 weeks.  Don't do strenuous activities, exercises, or sports like vacuuming, tennis, squash, etc. until your doctor says it is safe to do so.  Walk as you feel able. Rest often since it may take a week or two for your energy level to return to normal.   You may climb stairs  Avoid constipation:   -Eat fruits, vegetables, and whole grains. Eat small meals as your appetite will take time to return to normal.   -Drink 6 to 8 glasses of water each day unless your doctor has told you to limit your fluids.   -Use a laxative or stool softener as needed if constipation becomes a problem. You may take Miralax, metamucil, Citrucil, Colace, Senekot, FiberCon, etc. If this does not relieve the  constipation, try two tablespoons of Milk Of Magnesia every 8 hours until your bowels move.   You may shower.   Do not get in a hot tub, swimming pool, etc. until your doctor agrees.  Do not douche, use tampons, or have sex until your doctor says it is okay, usually about 2 weeks.  Take your pain medicine when you need it. The medicine may not work as well if the pain is bad.  Take the medicines you were taking before surgery. Other medications you might need are pain medications (ibuprofen), medications for constipation (Colace) and nausea medications (Zofran).

## 2024-09-30 NOTE — Op Note (Signed)
 Operative Report Hysteroscopy with Dilation and Curettage   Indications: Abnormal uterine bleeding   Pre-operative Diagnosis: Fibroids   Post-operative Diagnosis: same.  Procedure: 1. Exam under anesthesia 2. Fractional D&C 3. Hysteroscopy 4.  MyoSure myomectomy 5.  Mirena  IUD placement  Surgeon: Heather Penton, MD  Assistant(s):  None  Anesthesia: General LMA anesthesia  Anesthesiologist: Vicci Camellia Glatter, MD Anesthesiologist: Vicci Camellia Glatter, MD CRNA: Dyane Mass, CRNA  Estimated Blood Loss:  Minimal  Total IV Fluids:  Urine Output: 50ml  Total Fluid Deficit: 55 mL          Specimens: Endocervical curettings, endometrial curettings, Myosure myomectomy         Complications:  None; patient tolerated the procedure well.         Disposition: PACU - hemodynamically stable.         Condition: stable  Findings: Uterus measuring 9 cm by sound; normal cervix, vagina, perineum.  Of note, the patient's endocervical canal was entered tortuously.  It was very anterior and to the patient's left.  I was able to dilate only after finding this pathway visually.  It is clear why placing an IUD was so painful for her in the office.  I think with dilation at the right angle to the patient's left and dropping the hand, and IUD in the office would be feasible. Fundal fibroid, approximately 3 cm.  Indication for procedure/Consents: 43 y.o. F here for scheduled surgery for the aforementioned diagnoses.     Risks of surgery were discussed with the patient including but not limited to: bleeding which may require transfusion; infection which may require antibiotics; injury to uterus or surrounding organs; intrauterine scarring which may impair future fertility; need for additional procedures including laparotomy or laparoscopy; and other postoperative/anesthesia complications. Written informed consent was obtained.    Procedure Details:   D&C/ Myosure  The patient  was taken to the operating room where anesthesia was administered and was found to be adequate.  After a formal and adequate timeout was performed, she was placed in the dorsal lithotomy position and examined with the above findings. She was then prepped and draped in the sterile manner.   Her bladder was catheterized for an estimated amount of clear, yellow urine. A weighed speculum was then placed in the patient's vagina and a single tooth tenaculum was applied to the anterior lip of the cervix.  An endocervical curette was performed. Her cervix was serially dilated to 15 French using Hanks dilators. The hysteroscope was introduced under direct observation  Using lactated ringers as a distention medium to reveal the above findings. The uterine cavity was carefully examined, both ostia were recognized, and diffusely proliferative endometrium with the fibroid) above were noted.   This was resected using the Myosure device.  After further careful visualization of the uterine cavity, the hysteroscope was removed under direct visualization.  A sharp curettage was then performed until there was a gritty texture in all four quadrants.    A Mirena  IUD was placed and the strings were cut to 3 cm.  The tenaculum was removed from the anterior lip of the cervix and the vaginal speculum was removed after applying silver nitrate/pressure for good hemostasis.   The patient tolerated the procedure well and was taken to the recovery area awake and in stable condition. She received iv acetaminophen  prior to leaving the OR.  The patient will be discharged to home as per PACU criteria. Routine postoperative instructions given.  She was prescribed percocet  if needed, as she avoids NSAIDs including IV Toradol because of her history of Crohn's disease.SABRA  She will follow up in the clinic in two weeks for postoperative evaluation.

## 2024-10-01 ENCOUNTER — Encounter: Payer: Self-pay | Admitting: Obstetrics and Gynecology

## 2024-10-01 LAB — SURGICAL PATHOLOGY

## 2024-10-27 ENCOUNTER — Other Ambulatory Visit: Payer: Self-pay

## 2024-10-30 ENCOUNTER — Other Ambulatory Visit: Payer: Self-pay | Admitting: Family

## 2024-10-30 DIAGNOSIS — Z1231 Encounter for screening mammogram for malignant neoplasm of breast: Secondary | ICD-10-CM

## 2024-12-04 ENCOUNTER — Encounter
